# Patient Record
Sex: Male | Born: 1975 | Race: Black or African American | Hispanic: No | Marital: Single | Smoking: Never smoker
Health system: Southern US, Community
[De-identification: ages and names within clinical notes are randomized; demographics above are authoritative.]

## PROBLEM LIST (undated history)

## (undated) DIAGNOSIS — R011 Cardiac murmur, unspecified: Secondary | ICD-10-CM

## (undated) DIAGNOSIS — I10 Essential (primary) hypertension: Secondary | ICD-10-CM

## (undated) DIAGNOSIS — Z8709 Personal history of other diseases of the respiratory system: Secondary | ICD-10-CM

## (undated) HISTORY — PX: HERNIA REPAIR: SHX51

## (undated) HISTORY — DX: Essential (primary) hypertension: I10

## (undated) HISTORY — DX: Personal history of other diseases of the respiratory system: Z87.09

## (undated) HISTORY — PX: EYE SURGERY: SHX253

---

## 2015-03-30 ENCOUNTER — Other Ambulatory Visit (HOSPITAL_COMMUNITY): Payer: Self-pay | Admitting: Internal Medicine

## 2015-03-30 ENCOUNTER — Ambulatory Visit (HOSPITAL_COMMUNITY)
Admission: RE | Admit: 2015-03-30 | Discharge: 2015-03-30 | Disposition: A | Discharge status: Home / Self Care | Payer: PRIVATE HEALTH INSURANCE | Source: Ambulatory Visit | Attending: Internal Medicine | Admitting: Internal Medicine

## 2015-03-30 DIAGNOSIS — M25572 Pain in left ankle and joints of left foot: Secondary | ICD-10-CM

## 2015-06-12 ENCOUNTER — Emergency Department (HOSPITAL_COMMUNITY)
Admission: EM | Admit: 2015-06-12 | Discharge: 2015-06-13 | Disposition: A | Discharge status: Home / Self Care | Payer: No Typology Code available for payment source | Attending: Emergency Medicine | Admitting: Emergency Medicine

## 2015-06-12 ENCOUNTER — Encounter (HOSPITAL_COMMUNITY): Payer: Self-pay | Admitting: Emergency Medicine

## 2015-06-12 DIAGNOSIS — Z79899 Other long term (current) drug therapy: Secondary | ICD-10-CM | POA: Diagnosis not present

## 2015-06-12 DIAGNOSIS — I129 Hypertensive chronic kidney disease with stage 1 through stage 4 chronic kidney disease, or unspecified chronic kidney disease: Secondary | ICD-10-CM | POA: Insufficient documentation

## 2015-06-12 DIAGNOSIS — N19 Unspecified kidney failure: Secondary | ICD-10-CM

## 2015-06-12 DIAGNOSIS — R0602 Shortness of breath: Secondary | ICD-10-CM | POA: Insufficient documentation

## 2015-06-12 DIAGNOSIS — R61 Generalized hyperhidrosis: Secondary | ICD-10-CM | POA: Diagnosis not present

## 2015-06-12 DIAGNOSIS — M549 Dorsalgia, unspecified: Secondary | ICD-10-CM | POA: Diagnosis not present

## 2015-06-12 DIAGNOSIS — R55 Syncope and collapse: Secondary | ICD-10-CM

## 2015-06-12 DIAGNOSIS — N189 Chronic kidney disease, unspecified: Secondary | ICD-10-CM | POA: Insufficient documentation

## 2015-06-12 HISTORY — DX: Cardiac murmur, unspecified: R01.1

## 2015-06-12 HISTORY — DX: Essential (primary) hypertension: I10

## 2015-06-12 LAB — CBC WITH DIFFERENTIAL/PLATELET
BASOS ABS: 0 10*3/uL (ref 0.0–0.1)
Basophils Relative: 1 %
EOS ABS: 0.1 10*3/uL (ref 0.0–0.7)
EOS PCT: 1 %
HCT: 45.7 % (ref 39.0–52.0)
Hemoglobin: 15.2 g/dL (ref 13.0–17.0)
Lymphocytes Relative: 10 %
Lymphs Abs: 1.5 10*3/uL (ref 0.7–4.0)
MCH: 25.9 pg — ABNORMAL LOW (ref 26.0–34.0)
MCHC: 33.3 g/dL (ref 30.0–36.0)
MCV: 77.7 fL — AB (ref 78.0–100.0)
MONO ABS: 0.8 10*3/uL (ref 0.1–1.0)
Monocytes Relative: 6 %
Neutro Abs: 11.6 10*3/uL — ABNORMAL HIGH (ref 1.7–7.7)
Neutrophils Relative %: 83 %
PLATELETS: 256 10*3/uL (ref 150–400)
RBC: 5.88 MIL/uL — AB (ref 4.22–5.81)
RDW: 14.4 % (ref 11.5–15.5)
WBC: 14 10*3/uL — AB (ref 4.0–10.5)

## 2015-06-12 NOTE — ED Notes (Signed)
Pt had syncopal episode at work and when [facility] arrived pt had blood pressure in 80s systolic. Pt denies any pain, but states his pcp changed blood pressure meds 2 months ago. Blood sugar was 150

## 2015-06-12 NOTE — ED Notes (Signed)
Per Lab; CBC Values for 2342 are wrong.

## 2015-06-13 LAB — URINALYSIS, ROUTINE W REFLEX MICROSCOPIC
Bilirubin Urine: NEGATIVE
GLUCOSE, UA: NEGATIVE mg/dL
Hgb urine dipstick: NEGATIVE
KETONES UR: NEGATIVE mg/dL
LEUKOCYTES UA: NEGATIVE
Nitrite: NEGATIVE
PH: 5.5 (ref 5.0–8.0)
Protein, ur: NEGATIVE mg/dL
SPECIFIC GRAVITY, URINE: 1.025 (ref 1.005–1.030)

## 2015-06-13 LAB — COMPREHENSIVE METABOLIC PANEL
ALBUMIN: 4.8 g/dL (ref 3.5–5.0)
ALT: 36 U/L (ref 17–63)
AST: 19 U/L (ref 15–41)
Alkaline Phosphatase: 109 U/L (ref 38–126)
Anion gap: 7 (ref 5–15)
BILIRUBIN TOTAL: 0.6 mg/dL (ref 0.3–1.2)
BUN: 26 mg/dL — AB (ref 6–20)
CHLORIDE: 105 mmol/L (ref 101–111)
CO2: 25 mmol/L (ref 22–32)
Calcium: 9.4 mg/dL (ref 8.9–10.3)
Creatinine, Ser: 1.95 mg/dL — ABNORMAL HIGH (ref 0.61–1.24)
GFR calc Af Amer: 47 mL/min — ABNORMAL LOW (ref >60)
GFR calc non Af Amer: 41 mL/min — ABNORMAL LOW (ref >60)
GLUCOSE: 121 mg/dL — AB (ref 65–99)
POTASSIUM: 3.8 mmol/L (ref 3.5–5.1)
SODIUM: 137 mmol/L (ref 135–145)
Total Protein: 8.5 g/dL — ABNORMAL HIGH (ref 6.5–8.1)

## 2015-06-13 LAB — TROPONIN I: Troponin I: <0.03 ng/mL (ref <0.031)

## 2015-06-13 MED ORDER — SODIUM CHLORIDE 0.9 % IV BOLUS (SEPSIS)
1000.0000 mL | Freq: Once | INTRAVENOUS | Status: AC
  Administered 2015-06-13: 1000 mL via INTRAVENOUS

## 2015-06-13 NOTE — Discharge Instructions (Signed)

## 2015-06-13 NOTE — ED Provider Notes (Signed)
CSN: [id number]     Arrival date & time 06/12/15  2251 History  By signing my name below, I, Marisue HumbleMichelle Chaffee, attest that this documentation has been prepared under the direction and in the presence of Eber HongBrian Chaunte Hornbeck, MD . Electronically Signed: Marisue HumbleMichelle Chaffee, Scribe. 06/13/2015. 1:03 AM.    Chief Complaint  Patient presents with  . Loss of Consciousness   The history is provided by the patient. No language interpreter was used.   HPI Comments:  Jason Carpenter is a 40 y.o. male with PMHx of HTN who presents to the Emergency Department via EMS complaining of near-syncopal episode while at work ~4 hours ago. Pt began feeling diaphoretic then light-headed; he sat down without relief, but denies passing out. Pt reports associated difficulty breathing, back pain and nausea. Pt states he is still feeling mildly lightheaded currently. No alleviating factors noted. He reports good fluid intake and small PO intake today. He notes occasional ETOH use. Denies chest pain, abdominal pain, hematuria, dysuria, diarrhea, leg swelling, h/o CVA, drug use or h/o smoking.  Past Medical History  Diagnosis Date  . Hypertension   . Heart murmur    Past Surgical History  Procedure Laterality Date  . Hernia repair    . Eye surgery     History reviewed. No pertinent family history. Social History  Substance Use Topics  . Smoking status: Never Smoker   . Smokeless tobacco: None  . Alcohol Use: Yes    Review of Systems  Constitutional: Positive for diaphoresis.  Respiratory: Positive for shortness of breath.   Cardiovascular: Negative for chest pain and leg swelling.  Gastrointestinal: Positive for nausea. Negative for abdominal pain and diarrhea.  Genitourinary: Negative for dysuria.  Musculoskeletal: Positive for back pain.  Neurological: Positive for light-headedness. Negative for syncope.  All other systems reviewed and are negative.  Allergies  Penicillins  Home Medications   Prior to  Admission medications   Medication Sig Start Date End Date Taking? Authorizing Provider  UNKNOWN TO PATIENT Take 1 tablet by mouth daily.    Yes Historical Provider, MD   BP 116/73 mmHg  Pulse 70  Temp(Src) 97.9 F (36.6 C) (Oral)  Resp 20  Ht 5\' 3"  (1.6 m)  Wt 230 lb (104.327 kg)  BMI 40.75 kg/m2  SpO2 93% Physical Exam  Constitutional: He appears well-developed and well-nourished. No distress.  HENT:  Head: Normocephalic and atraumatic.  Mouth/Throat: Oropharynx is clear and moist. No oropharyngeal exudate.  Eyes: Conjunctivae and EOM are normal. Pupils are equal, round, and reactive to light. Right eye exhibits no discharge. Left eye exhibits no discharge. No scleral icterus.  Neck: Normal range of motion. Neck supple. No JVD present. No thyromegaly present.  Cardiovascular: Normal rate, regular rhythm, normal heart sounds and intact distal pulses.  Exam reveals no gallop and no friction rub.   No murmur heard. Pulmonary/Chest: Effort normal and breath sounds normal. No respiratory distress. He has no wheezes. He has no rales.  Abdominal: Soft. Bowel sounds are normal. He exhibits no distension and no mass. There is no tenderness.  Musculoskeletal: Normal range of motion. He exhibits no edema or tenderness.  Lymphadenopathy:    He has no cervical adenopathy.  Neurological: He is alert. Coordination normal.  Skin: Skin is warm and dry. No rash noted. No erythema.  Psychiatric: He has a normal mood and affect. His behavior is normal.  Nursing note and vitals reviewed.   ED Course  Procedures  DIAGNOSTIC STUDIES:  Oxygen Saturation is  100% on RA, normal by my interpretation.    COORDINATION OF CARE:  12:37 AM Will order EKG, troponin, CBC, CMP and UA. Discussed treatment plan with pt at bedside and pt agreed to plan.  Labs Review Labs Reviewed  CBC WITH DIFFERENTIAL/PLATELET - Abnormal; Notable for the following:    WBC 14.0 (*)    RBC 5.88 (*)    MCV 77.7 (*)    MCH  25.9 (*)    Neutro Abs 11.6 (*)    All other components within normal limits  COMPREHENSIVE METABOLIC PANEL - Abnormal; Notable for the following:    Glucose, Bld 121 (*)    BUN 26 (*)    Creatinine, Ser 1.95 (*)    Total Protein 8.5 (*)    GFR calc non Af Amer 41 (*)    GFR calc Af Amer 47 (*)    All other components within normal limits  URINALYSIS, ROUTINE W REFLEX MICROSCOPIC (NOT AT [facility])  TROPONIN I    Imaging Review No results found. I have personally reviewed and evaluated these images and lab results as part of my medical decision-making.   EKG Interpretation   Date/Time:  Monday Jun 12 2015 23:00:53 EDT Ventricular Rate:  88 PR Interval:  161 QRS Duration: 109 QT Interval:  372 QTC Calculation: 450 R Axis:   68 Text Interpretation:  Sinus rhythm ST elev, probable normal early repol  pattern Abnormal ekg No old tracing to compare Confirmed by Suann Klier  MD,  Lindsay Straka ([id number]) on 06/13/2015 12:39:41 AM      MDM   Final diagnoses:  Near syncope  Renal failure    The patient has no complaints at this time, he is totally asymptomatic, his EKG is slightly abnormal but appears to be nonischemic, repeat EKG looks improved, troponin is normal. His results show that he has no anemia, he has renal failure with a creatinine of 1.95 but there is no old labs to compare. His urinalysis is clear and clean and his troponin is normal. Cardiac monitoring reveals no arrhythmia, he is not orthostatic when he stands up.  The patient was informed of all of his results including his renal function and given a copy of his results and paper form to take to his doctor's office tomorrow. He is in agreement and will follow up as needed. I do not think he needs to be admitted to the hospital for cardiac monitoring or for further evaluation of his kidney function as he appears very stable and responsible to perform this in the outpatient setting.  Meds given in ED:  Medications  sodium chloride  0.9 % bolus 1,000 mL (1,000 mLs Intravenous New Bag/Given 06/13/15 0118)       Eber Hong, MD 06/13/15 [phone]

## 2018-09-04 ENCOUNTER — Emergency Department (HOSPITAL_COMMUNITY)
Admission: EM | Admit: 2018-09-04 | Discharge: 2018-09-04 | Disposition: A | Discharge status: Home / Self Care | Payer: Managed Care, Other (non HMO) | Attending: Emergency Medicine | Admitting: Emergency Medicine

## 2018-09-04 ENCOUNTER — Other Ambulatory Visit: Payer: Self-pay

## 2018-09-04 ENCOUNTER — Encounter (HOSPITAL_COMMUNITY): Payer: Self-pay

## 2018-09-04 DIAGNOSIS — Z8679 Personal history of other diseases of the circulatory system: Secondary | ICD-10-CM | POA: Diagnosis not present

## 2018-09-04 DIAGNOSIS — I10 Essential (primary) hypertension: Secondary | ICD-10-CM | POA: Insufficient documentation

## 2018-09-04 DIAGNOSIS — Z880 Allergy status to penicillin: Secondary | ICD-10-CM | POA: Insufficient documentation

## 2018-09-04 DIAGNOSIS — R2242 Localized swelling, mass and lump, left lower limb: Secondary | ICD-10-CM | POA: Diagnosis present

## 2018-09-04 DIAGNOSIS — Z9119 Patient's noncompliance with other medical treatment and regimen: Secondary | ICD-10-CM | POA: Diagnosis not present

## 2018-09-04 DIAGNOSIS — R600 Localized edema: Secondary | ICD-10-CM | POA: Insufficient documentation

## 2018-09-04 DIAGNOSIS — M7989 Other specified soft tissue disorders: Secondary | ICD-10-CM

## 2018-09-04 LAB — BASIC METABOLIC PANEL
Anion gap: 6 (ref 5–15)
BUN: 17 mg/dL (ref 6–20)
CO2: 26 mmol/L (ref 22–32)
Calcium: 8.7 mg/dL — ABNORMAL LOW (ref 8.9–10.3)
Chloride: 108 mmol/L (ref 98–111)
Creatinine, Ser: 0.64 mg/dL (ref 0.61–1.24)
GFR calc Af Amer: >60 mL/min (ref >60)
GFR calc non Af Amer: >60 mL/min (ref >60)
Glucose, Bld: 100 mg/dL — ABNORMAL HIGH (ref 70–99)
Potassium: 3.9 mmol/L (ref 3.5–5.1)
Sodium: 140 mmol/L (ref 135–145)

## 2018-09-04 LAB — CBC WITH DIFFERENTIAL/PLATELET
Abs Immature Granulocytes: 0.09 10*3/uL — ABNORMAL HIGH (ref 0.00–0.07)
Basophils Absolute: 0.1 10*3/uL (ref 0.0–0.1)
Basophils Relative: 1 %
Eosinophils Absolute: 0.2 10*3/uL (ref 0.0–0.5)
Eosinophils Relative: 3 %
HCT: 43.3 % (ref 39.0–52.0)
Hemoglobin: 13.8 g/dL (ref 13.0–17.0)
Immature Granulocytes: 1 %
Lymphocytes Relative: 16 %
Lymphs Abs: 1.4 10*3/uL (ref 0.7–4.0)
MCH: 25.1 pg — ABNORMAL LOW (ref 26.0–34.0)
MCHC: 31.9 g/dL (ref 30.0–36.0)
MCV: 78.7 fL — ABNORMAL LOW (ref 80.0–100.0)
Monocytes Absolute: 0.8 10*3/uL (ref 0.1–1.0)
Monocytes Relative: 9 %
Neutro Abs: 6.2 10*3/uL (ref 1.7–7.7)
Neutrophils Relative %: 70 %
Platelets: 273 10*3/uL (ref 150–400)
RBC: 5.5 MIL/uL (ref 4.22–5.81)
RDW: 14.9 % (ref 11.5–15.5)
WBC: 8.7 10*3/uL (ref 4.0–10.5)
nRBC: 0 % (ref 0.0–0.2)

## 2018-09-04 LAB — D-DIMER, QUANTITATIVE: D-Dimer, Quant: <0.27 ug/mL-FEU (ref 0.00–0.50)

## 2018-09-04 MED ORDER — HYDROCHLOROTHIAZIDE 12.5 MG PO CAPS
12.5000 mg | ORAL_CAPSULE | Freq: Once | ORAL | Status: AC
  Administered 2018-09-04: 12.5 mg via ORAL
  Filled 2018-09-04 (×2): qty 1

## 2018-09-04 MED ORDER — LISINOPRIL 10 MG PO TABS
10.0000 mg | ORAL_TABLET | Freq: Once | ORAL | Status: AC
  Administered 2018-09-04: 10 mg via ORAL
  Filled 2018-09-04: qty 1

## 2018-09-04 MED ORDER — LISINOPRIL-HYDROCHLOROTHIAZIDE 10-12.5 MG PO TABS: 1.0000 | ORAL_TABLET | Freq: Every day | ORAL | 0 refills | Status: DC

## 2018-09-04 NOTE — ED Provider Notes (Signed)
[facility] EMERGENCY DEPARTMENT Provider Note   CSN: [id number] Arrival date & time: 09/04/18  1554    History   Chief Complaint Chief Complaint  Patient presents with  . Leg Swelling    HPI Jason Carpenter is a 43 y.o. male with a history of untreated hypertension, stating he last took blood pressure medicine over a year ago presenting with left lower leg swelling which has been waxing and waning for the past 3 weeks.  He works in a job that requires standing for long periods of time, but states he has been doing this for over a year.  The left leg started swelling about 3 weeks ago, it will improve some with elevation but does not completely resolve.  He denies injury to the leg, there is no pain, but he does describe a tight feeling both of his anterior lower leg and in the calf.  His blood pressure was significantly elevated upon first arrival here.  He denies chest pain, shortness of breath, vision changes and current headache, but does endorse getting frequent headaches.  He has had no recent long car rides or plane trips, denies long periods of sedentary behavior.  He has found no alleviators for symptoms.     The history is provided by the patient.    Past Medical History:  Diagnosis Date  . Heart murmur   . Hypertension     There are no active problems to display for this patient.   Past Surgical History:  Procedure Laterality Date  . EYE SURGERY    . HERNIA REPAIR          Home Medications    Prior to Admission medications   Medication Sig Start Date End Date Taking? Authorizing Provider  ibuprofen (ADVIL) 200 MG tablet Take 200 mg by mouth every 6 (six) hours as needed for mild pain or moderate pain.   Yes [provider]  lisinopril-hydrochlorothiazide (ZESTORETIC) 10-12.5 MG tablet Take 1 tablet by mouth daily. 09/04/18   Burgess AmorIdol, Akaash Vandewater, PA-C    Family History History reviewed. No pertinent family history.  Social History Social History   Tobacco  Use  . Smoking status: Never Smoker  . Smokeless tobacco: Never Used  Substance Use Topics  . Alcohol use: Yes  . Drug use: No     Allergies   Penicillins   Review of Systems Review of Systems  Constitutional: Negative for chills and fever.  HENT: Negative for congestion and sore throat.   Eyes: Negative.   Respiratory: Negative for chest tightness and shortness of breath.   Cardiovascular: Positive for leg swelling. Negative for chest pain and palpitations.  Gastrointestinal: Negative for abdominal pain and nausea.  Genitourinary: Negative.   Musculoskeletal: Negative for arthralgias, joint swelling and neck pain.  Skin: Negative.  Negative for color change, rash and wound.  Neurological: Positive for headaches. Negative for dizziness, weakness, light-headedness and numbness.  Psychiatric/Behavioral: Negative.      Physical Exam Updated Vital Signs BP (!) 179/124 (BP Location: Left Arm)   Pulse 82   Temp 98 F (36.7 C) (Oral)   Resp 17   Ht 5\' 3"  (1.6 m)   Wt 108.9 kg   SpO2 96%   BMI 42.51 kg/m   Physical Exam Vitals signs and nursing note reviewed.  Constitutional:      Appearance: He is well-developed.  HENT:     Head: Normocephalic and atraumatic.  Eyes:     Conjunctiva/sclera: Conjunctivae normal.  Neck:  Musculoskeletal: Normal range of motion.  Cardiovascular:     Rate and Rhythm: Normal rate and regular rhythm.     Heart sounds: Normal heart sounds.  Pulmonary:     Effort: Pulmonary effort is normal.     Breath sounds: Normal breath sounds. No wheezing.  Abdominal:     Palpations: Abdomen is soft. There is no mass.     Tenderness: There is no abdominal tenderness.  Musculoskeletal: Normal range of motion.        General: No swelling, tenderness or deformity.     Right lower leg: No edema.     Left lower leg: Edema present.     Comments: Non pitting edema of left lower leg to upper calf.  No cords, no varicose veins appreciated.  Skin  intact, no rash or erythema.  Old fading bruise mid tibia, nontender.   Skin:    General: Skin is warm and dry.  Neurological:     Mental Status: He is alert.      ED Treatments / Results  Labs (all labs ordered are listed, but only abnormal results are displayed) Labs Reviewed  CBC WITH DIFFERENTIAL/PLATELET - Abnormal; Notable for the following components:      Result Value   MCV 78.7 (*)    MCH 25.1 (*)    Abs Immature Granulocytes 0.09 (*)    All other components within normal limits  BASIC METABOLIC PANEL - Abnormal; Notable for the following components:   Glucose, Bld 100 (*)    Calcium 8.7 (*)    All other components within normal limits  D-DIMER, QUANTITATIVE (NOT AT [facility])    EKG None  Radiology No results found.  Procedures Procedures (including critical care time)  Medications Ordered in ED Medications  lisinopril (ZESTRIL) tablet 10 mg (10 mg Oral Given 09/04/18 2159)  hydrochlorothiazide (MICROZIDE) capsule 12.5 mg (12.5 mg Oral Given 09/04/18 2241)     Initial Impression / Assessment and Plan / ED Course  I have reviewed the triage vital signs and the nursing notes.  Pertinent labs & imaging results that were available during my care of the patient were reviewed by me and considered in my medical decision making (see chart for details).        Patient reports that has been several years since he has been on blood pressure medication.  He recalls taking 2 medications one being a diuretic but cannot recall the name of either medicines.  A call to his pharmacy revealed that he was prescribed amlodipine in 2017, then was prescribed losartan in 2019 but never picked up this prescription.  Discussed this with the patient, he states that he had lost his insurance at the time and was unable to get his medications or to follow-up with his PCP.  This is no longer an issue and he is planning to reestablish care.  He was placed on lisinopril/HCTZ combination with his  first dose given here.  There is no evidence for endorgan damage tonight, he has no worrisome symptoms and his kidney function is normal.  His d-dimer is also negative, making DVT as a source of his leg swelling less likely.  However with the swelling being unilateral, ultrasound to rule out this possibility or other vascular issues in the leg was felt reasonable.  This was ordered as an outpatient, patient was given instructions to call for an appointment time tomorrow.  He understands this plan.  Patient's care and plan was discussed with Dr. Lacinda Axon prior to  discharge home.  Final Clinical Impressions(s) / ED Diagnoses   Final diagnoses:  Essential hypertension  Left leg swelling    ED Discharge Orders         Ordered    lisinopril-hydrochlorothiazide (ZESTORETIC) 10-12.5 MG tablet  Daily     09/04/18 2124    US Venous Img Lower Unilateral Left     09/04/18 2125           Burgess Amordol, Artie Takayama, PA-C 09/05/18 0145    Donnetta Hutchingook, Brian, MD 09/05/18 [phone]

## 2018-09-04 NOTE — Discharge Instructions (Addendum)
Call the phone number listed as discussed in the morning for your appointment time here for your ultrasound of your leg tomorrow - this is to ensure you do not have a blood clot in your leg.  Your lab tests are reassuring tonight.  You will be guided further tomorrow if your test is positive.  If it is negative for a clot,  I recommend using elevation as much as you can to help prevent swelling.

## 2018-09-04 NOTE — ED Triage Notes (Signed)
Pt reports leg swellling 3 weeks ago.  Stated that the swelling had went down but now was swollen again

## 2018-09-05 ENCOUNTER — Ambulatory Visit (HOSPITAL_COMMUNITY)
Admission: RE | Admit: 2018-09-05 | Discharge: 2018-09-05 | Disposition: A | Discharge status: Home / Self Care | Payer: Managed Care, Other (non HMO) | Source: Ambulatory Visit | Attending: Emergency Medicine | Admitting: Emergency Medicine

## 2018-09-05 DIAGNOSIS — M7989 Other specified soft tissue disorders: Secondary | ICD-10-CM | POA: Insufficient documentation

## 2018-09-05 NOTE — ED Provider Notes (Signed)
   Jason Carpenter returned today for scheduled out patient venous US of the left LE.  Results are negative for acute DVT and I have discussed this with Jason Carpenter.  He verbalized understanding of his results and agrees to arrange out Jason Carpenter f/u     US Venous Img Lower Unilateral Left  Result Date: 09/05/2018 CLINICAL DATA:  Left lower extremity swelling for 1 month EXAM: Left LOWER EXTREMITY VENOUS DOPPLER ULTRASOUND TECHNIQUE: Gray-scale sonography with graded compression, as well as color Doppler and duplex ultrasound were performed to evaluate the lower extremity deep venous systems from the level of the common femoral vein and including the common femoral, femoral, profunda femoral, popliteal and calf veins including the posterior tibial, peroneal and gastrocnemius veins when visible. The superficial great saphenous vein was also interrogated. Spectral Doppler was utilized to evaluate flow at rest and with distal augmentation maneuvers in the common femoral, femoral and popliteal veins. COMPARISON:  None. FINDINGS: Contralateral Common Femoral Vein: Respiratory phasicity is normal and symmetric with the symptomatic side. No evidence of thrombus. Normal compressibility. Common Femoral Vein: No evidence of thrombus. Normal compressibility, respiratory phasicity and response to augmentation. Saphenofemoral Junction: No evidence of thrombus. Normal compressibility and flow on color Doppler imaging. Profunda Femoral Vein: No evidence of thrombus. Normal compressibility and flow on color Doppler imaging. Femoral Vein: No evidence of thrombus. Normal compressibility, respiratory phasicity and response to augmentation. Popliteal Vein: No evidence of thrombus. Normal compressibility, respiratory phasicity and response to augmentation. Calf Veins: No evidence of thrombus. Normal compressibility and flow on color Doppler imaging. Superficial Great Saphenous Vein: No evidence of thrombus. Normal compressibility. Venous Reflux:  None. Other  Findings: A top-normal size 1.3 cm short axis left inguinal lymph node demonstrates no overtly suspicious sonographic features. IMPRESSION: No evidence of deep venous thrombosis in the left lower extremity. Electronically Signed   By: Ilona Sorrel M.D.   On: 09/05/2018 10:38      Kem Parkinson, PA-C 09/05/18 [place], [place], DO 09/06/18 0730

## 2018-10-23 ENCOUNTER — Emergency Department (HOSPITAL_COMMUNITY)
Admission: EM | Admit: 2018-10-23 | Discharge: 2018-10-23 | Disposition: A | Discharge status: Home / Self Care | Payer: Managed Care, Other (non HMO) | Attending: Emergency Medicine | Admitting: Emergency Medicine

## 2018-10-23 ENCOUNTER — Encounter (HOSPITAL_COMMUNITY): Payer: Self-pay | Admitting: *Deleted

## 2018-10-23 ENCOUNTER — Other Ambulatory Visit: Payer: Self-pay

## 2018-10-23 DIAGNOSIS — A64 Unspecified sexually transmitted disease: Secondary | ICD-10-CM | POA: Diagnosis not present

## 2018-10-23 DIAGNOSIS — R369 Urethral discharge, unspecified: Secondary | ICD-10-CM | POA: Diagnosis present

## 2018-10-23 DIAGNOSIS — I10 Essential (primary) hypertension: Secondary | ICD-10-CM | POA: Insufficient documentation

## 2018-10-23 MED ORDER — AZITHROMYCIN 250 MG PO TABS
1000.0000 mg | ORAL_TABLET | Freq: Once | ORAL | Status: AC
  Administered 2018-10-23: 1000 mg via ORAL
  Filled 2018-10-23: qty 4

## 2018-10-23 MED ORDER — CEFTRIAXONE SODIUM 250 MG IJ SOLR
250.0000 mg | Freq: Once | INTRAMUSCULAR | Status: AC
  Administered 2018-10-23: 250 mg via INTRAMUSCULAR
  Filled 2018-10-23: qty 250

## 2018-10-23 MED ORDER — LIDOCAINE HCL (PF) 1 % IJ SOLN
INTRAMUSCULAR | Status: AC
  Filled 2018-10-23: qty 2

## 2018-10-23 NOTE — ED Provider Notes (Signed)
[facility] EMERGENCY DEPARTMENT Provider Note   CSN: [id number] Arrival date & time: 10/23/18  5053     History   Chief Complaint No chief complaint on file.   HPI Jason Carpenter is a 43 y.o. male.     Patient with history of high blood pressure presents with penile discharge since yesterday.  Patient had sexual intercourse recently and the condom broke.  Patient has had relations this with this woman in the past.  No personal history of STDs.     Past Medical History:  Diagnosis Date   Heart murmur    Hypertension     There are no active problems to display for this patient.   Past Surgical History:  Procedure Laterality Date   EYE SURGERY     HERNIA REPAIR          Home Medications    Prior to Admission medications   Medication Sig Start Date End Date Taking? Authorizing Provider  ibuprofen (ADVIL) 200 MG tablet Take 200 mg by mouth every 6 (six) hours as needed for mild pain or moderate pain.    [provider]  lisinopril-hydrochlorothiazide (ZESTORETIC) 10-12.5 MG tablet Take 1 tablet by mouth daily. 09/04/18   Burgess Amor, PA-C    Family History No family history on file.  Social History Social History   Tobacco Use   Smoking status: Never Smoker   Smokeless tobacco: Never Used  Substance Use Topics   Alcohol use: Yes    Comment: "once in a while" per pt    Drug use: No     Allergies   Penicillins   Review of Systems Review of Systems  Constitutional: Negative for chills and fever.  HENT: Negative for congestion.   Gastrointestinal: Negative for abdominal pain and vomiting.  Genitourinary: Positive for discharge. Negative for dysuria, flank pain and scrotal swelling.  Musculoskeletal: Negative for back pain, neck pain and neck stiffness.  Skin: Negative for rash.  Neurological: Negative for light-headedness and headaches.     Physical Exam Updated Vital Signs BP (!) 164/101    Pulse 89    Temp 98.3 F (36.8 C)  (Oral)    Resp 16    Ht 5\' 3"  (1.6 m)    Wt 111.1 kg    SpO2 97%    BMI 43.40 kg/m   Physical Exam Vitals signs and nursing note reviewed.  Constitutional:      Appearance: He is well-developed.  HENT:     Head: Normocephalic.  Eyes:     General:        Right eye: No discharge.        Left eye: No discharge.  Neck:     Musculoskeletal: Normal range of motion and neck supple.     Trachea: No tracheal deviation.  Cardiovascular:     Rate and Rhythm: Normal rate.  Pulmonary:     Effort: Pulmonary effort is normal.  Abdominal:     General: There is no distension.  Genitourinary:    Comments: Patient has mild opaque discharge, no swelling or tenderness Skin:    General: Skin is warm.     Findings: No rash.  Neurological:     Mental Status: He is alert.      ED Treatments / Results  Labs (all labs ordered are listed, but only abnormal results are displayed) Labs Reviewed - No data to display  EKG None  Radiology No results found.  Procedures Procedures (including critical care time)  Medications Ordered  in ED Medications  cefTRIAXone (ROCEPHIN) injection 250 mg (has no administration in time range)  azithromycin (ZITHROMAX) tablet 1,000 mg (has no administration in time range)     Initial Impression / Assessment and Plan / ED Course  I have reviewed the triage vital signs and the nursing notes.  Pertinent labs & imaging results that were available during my care of the patient were reviewed by me and considered in my medical decision making (see chart for details).       Patient presents with clinical concern for STDs.  Discussed outpatient follow-up.  Intramuscular Rocephin and oral azithromycin given.  Patient has mild penicillin allergy so no contraindication to cephalosporin.  Final Clinical Impressions(s) / ED Diagnoses   Final diagnoses:  STD (male)    ED Discharge Orders    None       Elnora Morrison, MD 10/23/18 1056

## 2018-10-23 NOTE — ED Triage Notes (Addendum)
Pt c/o clear penile discharge and irritation since Sunday. Denies dysuria. Pt reports he takes BP medication but did not take it this morning PTA. Pt's BP 161/101 in triage.

## 2018-10-23 NOTE — Discharge Instructions (Addendum)
Make sure you tell your sexual partner of your symptoms so she can be treated and evaluated. No sexual intercourse for at least 1 week.

## 2018-11-11 ENCOUNTER — Ambulatory Visit: Payer: Managed Care, Other (non HMO) | Admitting: Cardiology

## 2018-11-11 ENCOUNTER — Encounter: Payer: Self-pay | Admitting: Cardiology

## 2018-11-11 ENCOUNTER — Other Ambulatory Visit: Payer: Self-pay

## 2018-11-11 VITALS — BP 156/92 | HR 90 | Ht 63.0 in | Wt 253.0 lb

## 2018-11-11 DIAGNOSIS — I10 Essential (primary) hypertension: Secondary | ICD-10-CM

## 2018-11-11 DIAGNOSIS — R011 Cardiac murmur, unspecified: Secondary | ICD-10-CM | POA: Diagnosis not present

## 2018-11-11 DIAGNOSIS — R0789 Other chest pain: Secondary | ICD-10-CM

## 2018-11-11 NOTE — Progress Notes (Signed)
Cardiology Office Note  Date: 11/11/2018   ID: Jason Carpenter, DOB 1975/06/19, MRN [id number]  PCP:  Celene Squibb, MD  Consulting Cardiologist: Satira Sark, MD Electrophysiologist:  None   Chief Complaint  Patient presents with  . History of chest pain    History of Present Illness: Jason Carpenter is a 43 y.o. male referred for cardiology consultation by Dr. Nevada Crane for the evaluation of chest pain.  I reviewed available records.  Patient presents today, we discussed symptoms and he denied having any problems with chest pain.  He occasionally experiences a brief sense of palpitations, but not on a regular basis, not associated with shortness of breath or syncope.  His main concern was about blood pressure.  He states that he cannot tolerate Zestoretic due to swelling of his eyelids, cramping, and also headache.  Records suggest that he took Norvasc in the past.  He has not followed up with his PCP since August.  He also reports a history of heart murmur, details are not clear.  He recalls being told this in childhood.  He has not had a recent echocardiogram.  Past Medical History:  Diagnosis Date  . Essential hypertension   . Heart murmur   . History of asthma     Past Surgical History:  Procedure Laterality Date  . EYE SURGERY    . HERNIA REPAIR      Current Outpatient Medications  Medication Sig Dispense Refill  . ibuprofen (ADVIL) 200 MG tablet Take 200 mg by mouth every 6 (six) hours as needed for mild pain or moderate pain.    Marland Kitchen lisinopril-hydrochlorothiazide (ZESTORETIC) 10-12.5 MG tablet Take 1 tablet by mouth daily. 30 tablet 0   No current facility-administered medications for this visit.    Allergies:  Penicillins   Social History: The patient  reports that he has never smoked. He has never used smokeless tobacco. He reports current alcohol use. He reports that he does not use drugs.   Family History: The patient's family history includes High blood  pressure in his mother.   ROS:  Please see the history of present illness. Otherwise, complete review of systems is positive for none.  All other systems are reviewed and negative.   Physical Exam: VS:  BP (!) 156/92   Pulse 90   Ht 5\' 3"  (1.6 m)   Wt 253 lb (114.8 kg)   SpO2 96%   BMI 44.82 kg/m , BMI Body mass index is 44.82 kg/m.  Wt Readings from Last 3 Encounters:  11/11/18 253 lb (114.8 kg)  10/23/18 245 lb (111.1 kg)  09/04/18 240 lb (108.9 kg)    General: Obese male, appears comfortable at rest. HEENT: Conjunctiva and redundant lid tissue, wearing a mask. Neck: Supple, no elevated JVP or carotid bruits, no thyromegaly. Lungs: Clear to auscultation, nonlabored breathing at rest. Cardiac: Regular rate and rhythm, no S3, soft systolic murmur, no pericardial rub. Abdomen: Obese, nontender, bowel sounds present, no guarding or rebound. Extremities: No pitting edema, distal pulses 2+. Skin: Warm and dry. Musculoskeletal: No kyphosis. Neuropsychiatric: Alert and oriented x3, affect grossly appropriate.  ECG:  An ECG dated 06/14/2015 was personally reviewed today and demonstrated:  Sinus rhythm with diffuse ST segment abnormalities, possible early repolarization.  Recent Labwork: 09/04/2018: BUN 17; Creatinine, Ser 0.64; Hemoglobin 13.8; Platelets 273; Potassium 3.9; Sodium 140   Other Studies Reviewed Today:  Left lower extremity venous Doppler 09/05/2018: IMPRESSION: No evidence of deep venous thrombosis in the left lower  extremity.  Assessment and Plan:  1.  Essential hypertension by history.  Pressure is not controlled, he is currently not on any antihypertensive medications.  He reports intolerance and possibly allergy to Zestoretic.  I have recommended that he follow-up with his PCP and consider going back on Norvasc which he apparently tolerated in the past.  He might even be able to tolerate Aldactone in addition if needed.  2.  Heart murmur, likely benign.  He does  report history of childhood heart murmur however.  We will obtain an echocardiogram for basic cardiac structural assessment.  3.  Obesity.  Weight loss would be beneficial.  Medication Adjustments/Labs and Tests Ordered: Current medicines are reviewed at length with the patient today.  Concerns regarding medicines are outlined above.   Tests Ordered: Orders Placed This Encounter  Procedures  . ECHOCARDIOGRAM COMPLETE    Medication Changes: No orders of the defined types were placed in this encounter.   Disposition:  Follow up test results.  Signed, Jonelle Sidle, MD, [facility] 11/11/2018 2:58 PM    [facility] [address], [place], [place] [address] Phone: [phone]; Fax: [phone]

## 2018-11-11 NOTE — Patient Instructions (Signed)
Medication Instructions:  Your physician recommends that you continue on your current medications as directed. Please refer to the Current Medication list given to you today.  If you need a refill on your cardiac medications before your next appointment, please call your pharmacy.   Lab work: NONE  If you have labs (blood work) drawn today and your tests are completely normal, you will receive your results only by: Marland Kitchen MyChart Message (if you have MyChart) OR . A paper copy in the mail If you have any lab test that is abnormal or we need to change your treatment, we will call you to review the results.  Testing/Procedures: Your physician has requested that you have an echocardiogram. Echocardiography is a painless test that uses sound waves to create images of your heart. It provides your doctor with information about the size and shape of your heart and how well your heart's chambers and valves are working. This procedure takes approximately one hour. There are no restrictions for this procedure.    Follow-Up: At [facility], you and your health needs are our priority.  As part of our continuing mission to provide you with exceptional heart care, we have created designated Provider Care Teams.  These Care Teams include your primary Cardiologist (physician) and Advanced Practice Providers (APPs -  Physician Assistants and Nurse Practitioners) who all work together to provide you with the care you need, when you need it. You will need a follow up appointment in Pending test results .  Please call our office 2 months in advance to schedule this appointment.  You may see No primary care provider on file. or one of the following Advanced Practice Providers on your designated Care Team:   [place], PA-C [facility]) . Ermalinda Barrios, PA-C ([place])  Any Other Special Instructions Will Be Listed Below (If Applicable). Thank you for choosing [facility]!

## 2018-11-20 ENCOUNTER — Ambulatory Visit (HOSPITAL_COMMUNITY)
Admission: RE | Admit: 2018-11-20 | Discharge: 2018-11-20 | Disposition: A | Discharge status: Home / Self Care | Payer: Managed Care, Other (non HMO) | Source: Ambulatory Visit | Attending: Internal Medicine | Admitting: Internal Medicine

## 2018-11-20 ENCOUNTER — Other Ambulatory Visit: Payer: Self-pay

## 2018-11-20 DIAGNOSIS — R011 Cardiac murmur, unspecified: Secondary | ICD-10-CM | POA: Insufficient documentation

## 2018-11-20 NOTE — Progress Notes (Signed)
*  PRELIMINARY RESULTS* Echocardiogram 2D Echocardiogram has been performed. Patient's blood pressure was 172/102and171/110. Patient told me he had stop taking his blood pressure medication a week ago due to eyelid swelling and cramping. Patient reported no dizziness, headache or blurred vision. I contacted Dr.Hall's office with patient present, they said they would call him and call in a different medication.  Leavy Cella 11/20/2018, 9:16 AM

## 2018-11-23 ENCOUNTER — Telehealth: Payer: Self-pay

## 2018-11-23 NOTE — Telephone Encounter (Signed)
LMTCB-cc 

## 2018-11-23 NOTE — Telephone Encounter (Signed)
-----   Message from Samuel G McDowell, MD sent at 11/22/2018 12:34 PM EDT ----- Results reviewed.  LVEF is normal at 60 to 65%.  He does have diastolic dysfunction which would be consistent with his history of uncontrolled hypertension.  No major valvular abnormalities beyond mild aortic regurgitation.  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  I would recommend that we schedule a chest CTA for evaluation of thoracic aortic anatomy and better determine plan for follow-up.  Please see my recent office consultation, he needs to make sure that he follows up with PCP to get back on regular therapy for treatment of high blood pressure, particularly given recent reported medication intolerance. 

## 2018-11-24 ENCOUNTER — Telehealth: Payer: Self-pay | Admitting: *Deleted

## 2018-11-24 ENCOUNTER — Other Ambulatory Visit: Payer: Self-pay | Admitting: *Deleted

## 2018-11-24 DIAGNOSIS — I10 Essential (primary) hypertension: Secondary | ICD-10-CM

## 2018-11-24 NOTE — Telephone Encounter (Signed)
Called asking better clarification of results.  Patient did not understand.

## 2018-11-24 NOTE — Telephone Encounter (Signed)
-----   Message from Satira Sark, MD sent at 11/22/2018 12:34 PM EDT ----- Results reviewed.  LVEF is normal at 60 to 65%.  He does have diastolic dysfunction which would be consistent with his history of uncontrolled hypertension.  No major valvular abnormalities beyond mild aortic regurgitation.  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  I would recommend that we schedule a chest CTA for evaluation of thoracic aortic anatomy and better determine plan for follow-up.  Please see my recent office consultation, he needs to make sure that he follows up with PCP to get back on regular therapy for treatment of high blood pressure, particularly given recent reported medication intolerance.

## 2018-11-25 NOTE — Telephone Encounter (Signed)
Spoke with pt. Results reviewed again. Pt voiced understanding. Pt voiced that he is unable to contact PCP b/c he gets the VM. Spoke with Ginger at Dr. Juel Burrow office. Informed her of the need to have pt contacted. Ginger confirmed pt phone number and stated she would reach out to pt today.

## 2018-11-25 NOTE — Telephone Encounter (Signed)
Returned pt call. NA, LMTCB  

## 2019-12-20 IMAGING — US VENOUS DOPPLER ULTRASOUND OF LEFT LOWER EXTREMITY
1 series · 13 of 24 positions shown · non-contrast
Comparison: None.

CLINICAL DATA: Left lower extremity swelling for 1 month



[Series 1: venous doppler ultrasound of left lower extremity · 13 of 41 slices shown]
[im 1/41]
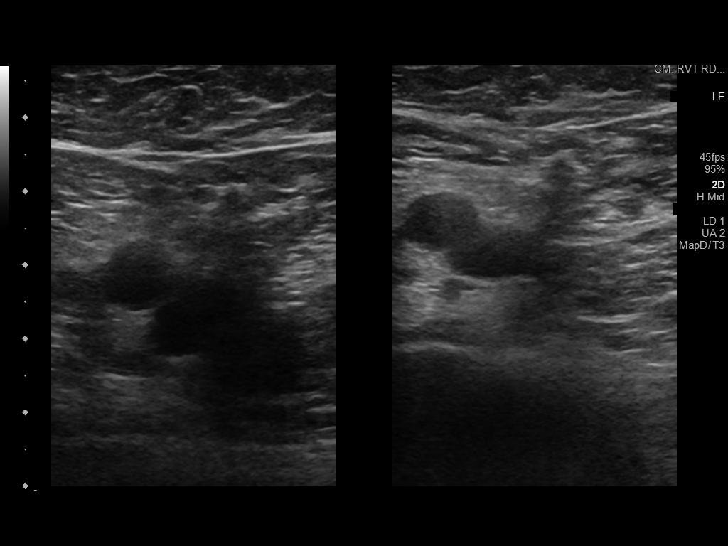
[im 4/41]
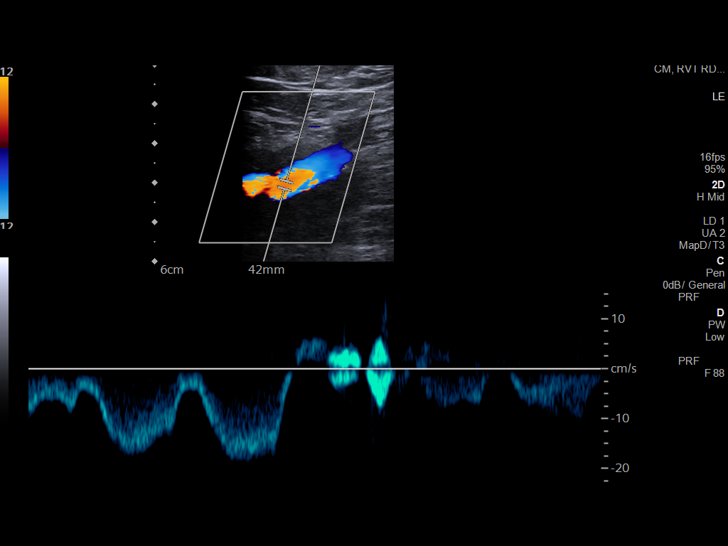
[im 7/41]
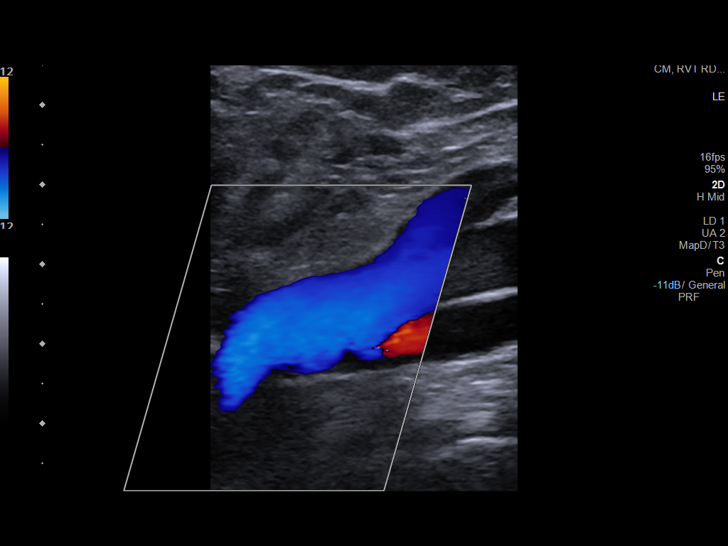
[im 11/41]
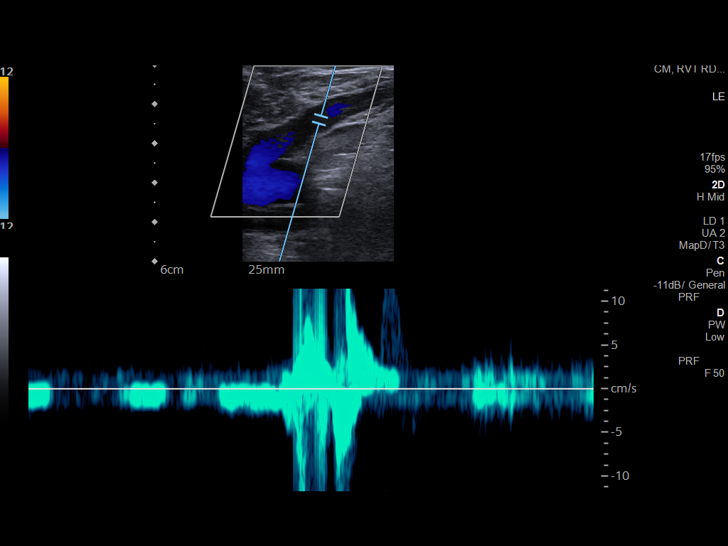
[im 14/41]
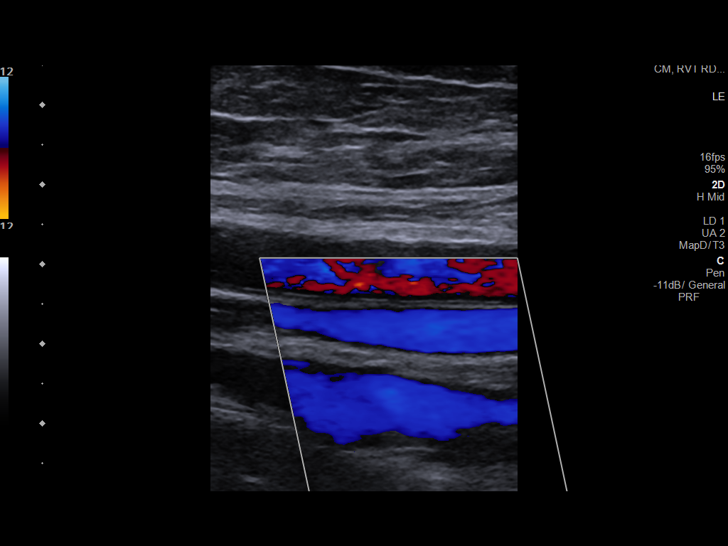
[im 18/41]
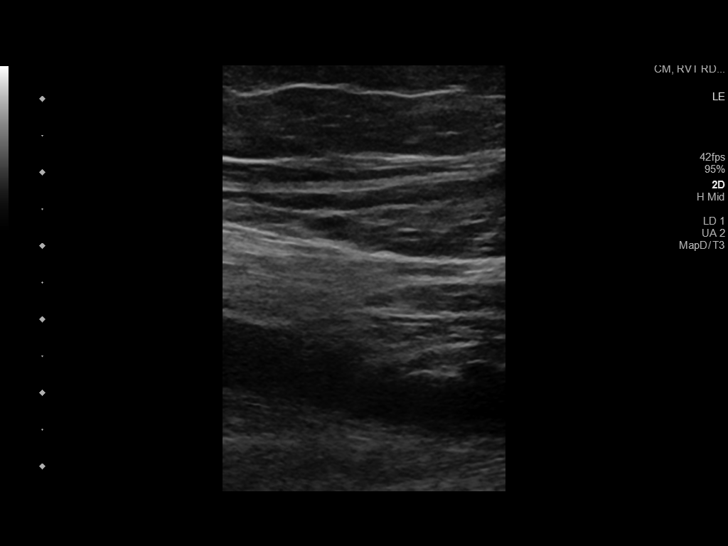
[im 21/41]
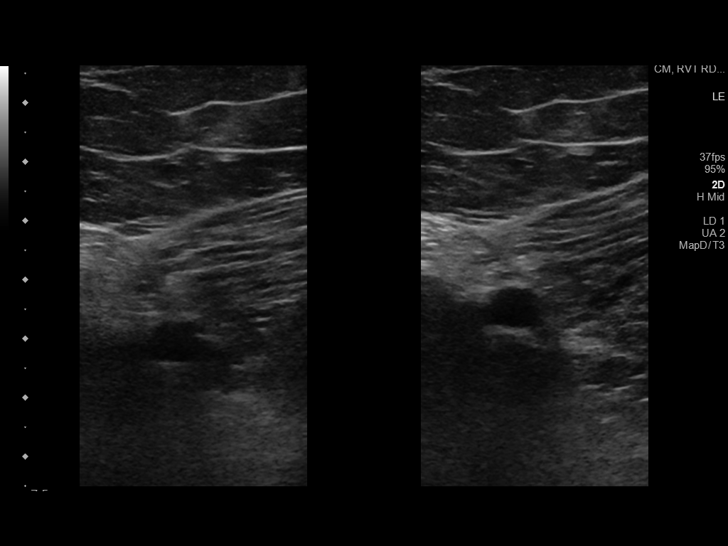
[im 23/41]
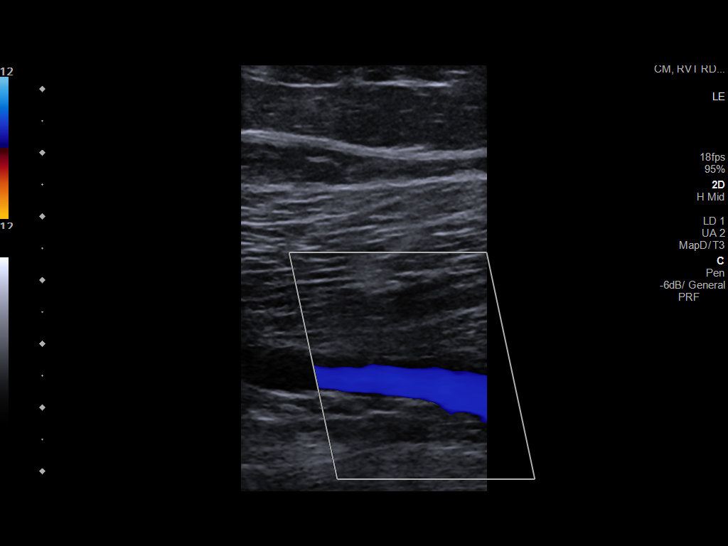
[im 27/41]
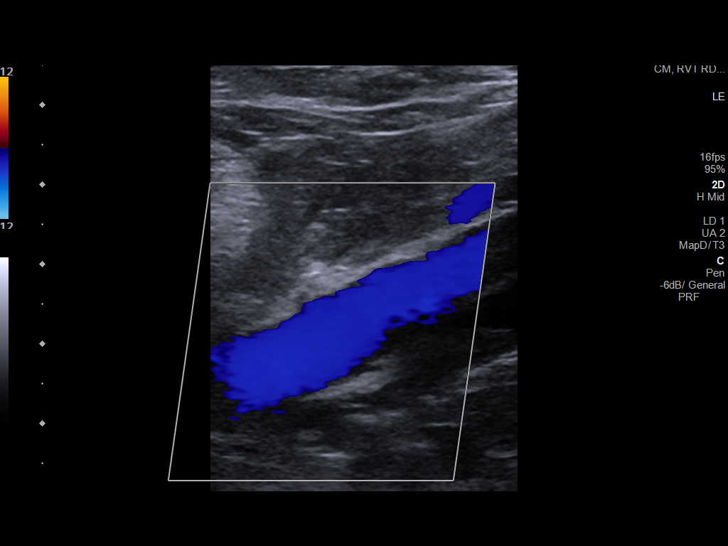
[im 30/41]
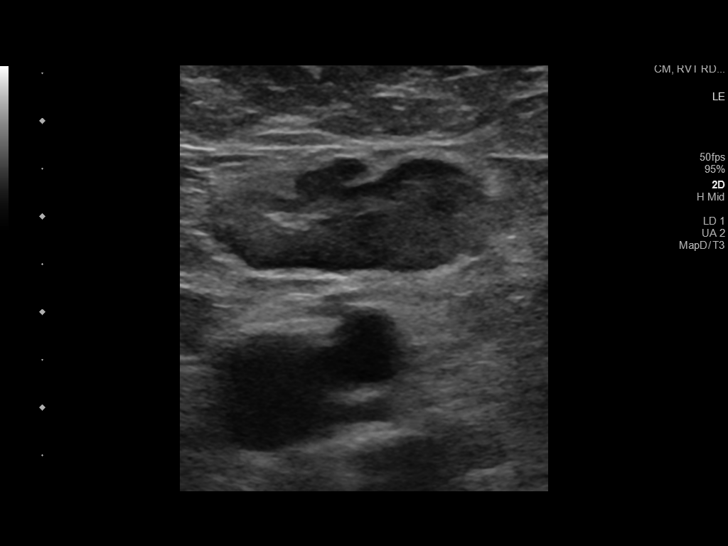
[im 34/41]
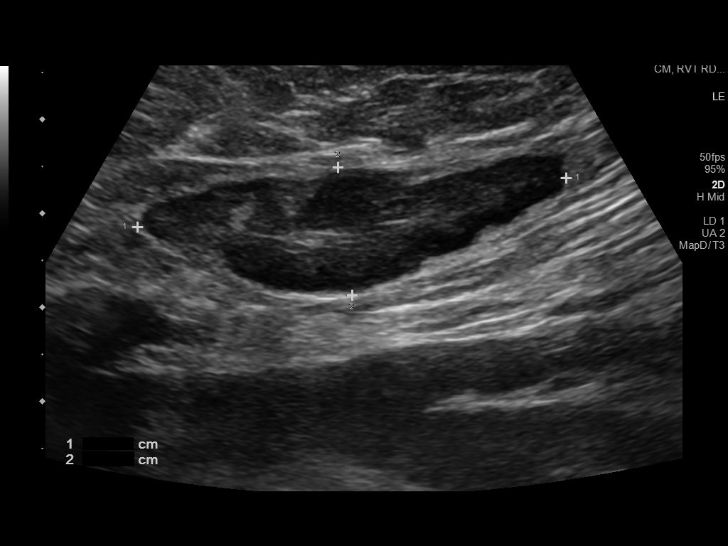
[im 37/41]
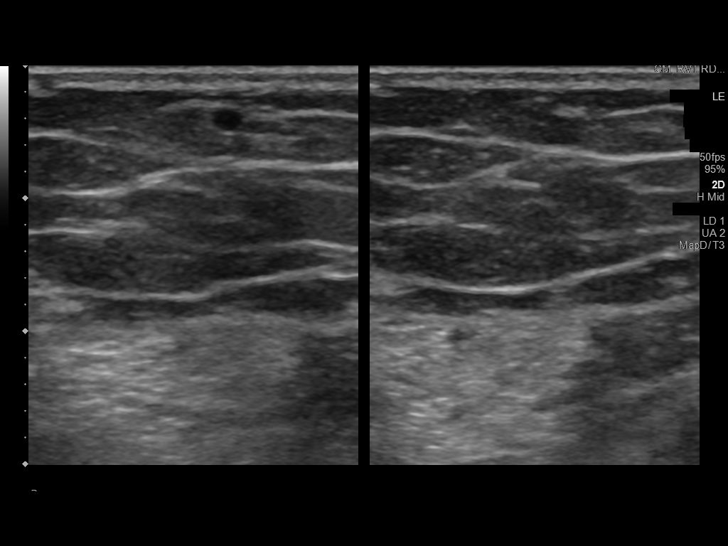
[im 41/41]
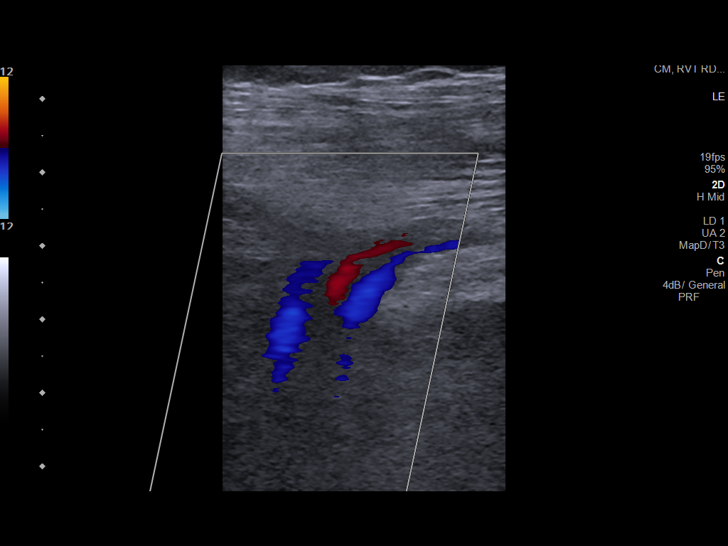

[13 of 24 positions shown; findings below may reference images not displayed]

FINDINGS: Contralateral Common Femoral Vein: Respiratory phasicity is normal
and symmetric with the symptomatic side. No evidence of thrombus.
Normal compressibility.

Common Femoral Vein: No evidence of thrombus. Normal
compressibility, respiratory phasicity and response to augmentation.

Saphenofemoral Junction: No evidence of thrombus. Normal
compressibility and flow on color Doppler imaging.

Profunda Femoral Vein: No evidence of thrombus. Normal
compressibility and flow on color Doppler imaging.

Femoral Vein: No evidence of thrombus. Normal compressibility,
respiratory phasicity and response to augmentation.

Popliteal Vein: No evidence of thrombus. Normal compressibility,
respiratory phasicity and response to augmentation.

Calf Veins: No evidence of thrombus. Normal compressibility and flow
on color Doppler imaging.

Superficial Great Saphenous Vein: No evidence of thrombus. Normal
compressibility.

Venous Reflux:  None.

Other Findings: A top-normal size 1.3 cm short axis left inguinal
lymph node demonstrates no overtly suspicious sonographic features.
IMPRESSION: No evidence of deep venous thrombosis in the left lower extremity.

## 2021-12-28 DIAGNOSIS — Z419 Encounter for procedure for purposes other than remedying health state, unspecified: Secondary | ICD-10-CM | POA: Diagnosis not present

## 2022-01-03 ENCOUNTER — Encounter (HOSPITAL_COMMUNITY): Payer: Self-pay | Admitting: *Deleted

## 2022-01-03 ENCOUNTER — Emergency Department (HOSPITAL_COMMUNITY)
Admission: EM | Admit: 2022-01-03 | Discharge: 2022-01-03 | Disposition: A | Discharge status: Home / Self Care | Payer: Medicaid Other | Attending: Emergency Medicine | Admitting: Emergency Medicine

## 2022-01-03 ENCOUNTER — Other Ambulatory Visit: Payer: Self-pay

## 2022-01-03 ENCOUNTER — Emergency Department (HOSPITAL_COMMUNITY): Payer: Medicaid Other

## 2022-01-03 DIAGNOSIS — X500XXA Overexertion from strenuous movement or load, initial encounter: Secondary | ICD-10-CM | POA: Insufficient documentation

## 2022-01-03 DIAGNOSIS — M25521 Pain in right elbow: Secondary | ICD-10-CM | POA: Diagnosis not present

## 2022-01-03 DIAGNOSIS — Y990 Civilian activity done for income or pay: Secondary | ICD-10-CM | POA: Diagnosis not present

## 2022-01-03 MED ORDER — LIDOCAINE 5 % EX PTCH
1.0000 | MEDICATED_PATCH | CUTANEOUS | Status: DC
  Administered 2022-01-03: 1 via TRANSDERMAL
  Filled 2022-01-03 (×2): qty 1

## 2022-01-03 MED ORDER — LISINOPRIL 10 MG PO TABS
10.0000 mg | ORAL_TABLET | Freq: Once | ORAL | Status: AC
  Administered 2022-01-03: 10 mg via ORAL
  Filled 2022-01-03: qty 1

## 2022-01-03 MED ORDER — HYDROCHLOROTHIAZIDE 12.5 MG PO TABS
12.5000 mg | ORAL_TABLET | Freq: Every day | ORAL | Status: DC
  Administered 2022-01-03: 12.5 mg via ORAL
  Filled 2022-01-03: qty 1

## 2022-01-03 NOTE — Discharge Instructions (Addendum)
You were seen today for evaluation of your right elbow pain. I likely think you may have some muscle injury. This may need additional imaging and will need further evaluation by an orthopedic provider. I have included the information to Dr. Dallas Schimke in in the discharge paperwork. Please make sure you call him to schedule an appointment. I recommended ibuprofen 600mg  and Tylenol 1000mg  every 6 hours as needed for pain. You can keep the ACE bandage on for a few hours or while at work to help with pain. You can also try lidocaine patches as well, you can pick these up OTC. If you have any worsening pain, weakness, fever, redness, numbness, or tingling, please return to the nearest ER for evaluation.    Contact a doctor if: You have pain that gets worse and does not get better with medicine. Your joint pain does not get better in 3 days. You have more bruising or swelling. You have a fever. You lose 10 lb (4.5 kg) or more without trying. Get help right away if: You cannot move the joint. Your fingers or toes tingle, become numb. or get cold and blue. You have a fever along with a joint that is red, warm, and swollen.

## 2022-01-03 NOTE — ED Provider Notes (Signed)
[facility] EMERGENCY DEPARTMENT Provider Note   CSN: [id number] Arrival date & time: 01/03/22  4765     History Chief Complaint  Patient presents with   Arm Pain    Jason Carpenter is a 46 y.o. male with h/o HTN presents to the ER for evaluation of right elbow pain since Monday.  Patient reports that he was lifting a box while at work and he heard a pop and felt pain in his right elbow.  He denies any numbness or tingling.  He reports it radiates some into his upper arm.  He has had pain since with lifting.  He has pain more with flexion than with extension.  He has tried Tylenol for pain without much relief.  He denies any fevers or increased swelling.  He denies any chest pain or shortness of breath.   Arm Pain Pertinent negatives include no chest pain and no shortness of breath.       Home Medications Prior to Admission medications   Medication Sig Start Date End Date Taking? Authorizing Provider  ibuprofen (ADVIL) 200 MG tablet Take 200 mg by mouth every 6 (six) hours as needed for mild pain or moderate pain.    [provider]  lisinopril-hydrochlorothiazide (ZESTORETIC) 10-12.5 MG tablet Take 1 tablet by mouth daily. 09/04/18   Burgess Amor, PA-C      Allergies    Penicillins    Review of Systems   Review of Systems  Constitutional:  Negative for chills and fever.  Respiratory:  Negative for shortness of breath.   Cardiovascular:  Negative for chest pain.  Musculoskeletal:  Positive for arthralgias.    Physical Exam Updated Vital Signs BP (!) 176/102 (BP Location: Left Arm)   Pulse 80   Temp 98 F (36.7 C) (Oral)   Resp 14   Ht 5\' 3"  (1.6 m)   Wt 106.6 kg   SpO2 97%   BMI 41.63 kg/m  Physical Exam Vitals and nursing note reviewed.  Constitutional:      General: He is not in acute distress.    Appearance: Normal appearance. He is not ill-appearing or toxic-appearing.  Eyes:     General: No scleral icterus. Pulmonary:     Effort: Pulmonary effort  is normal. No respiratory distress.  Musculoskeletal:       Arms:     Comments: Small marked area of swelling without overlying erythema or warmth. No bruising noted. The patient does have intact strength, but has more pain with flexion, especially flexion with resistance, of the right arm. Cap refill brisk, palpable pulses, compartments are soft.  No other overlying skin changes noted.  Patient does not have any tenderness into his pec muscles or to his right shoulder.  Other than the small marked area of swelling, arms looks symmetric to each other.  There is no Popeye sign.  Skin:    General: Skin is dry.     Findings: No rash.  Neurological:     General: No focal deficit present.     Mental Status: He is alert. Mental status is at baseline.  Psychiatric:        Mood and Affect: Mood normal.     ED Results / Procedures / Treatments   Labs (all labs ordered are listed, but only abnormal results are displayed) Labs Reviewed - No data to display  EKG None  Radiology DG Elbow Complete Right  Result Date: 01/03/2022 CLINICAL DATA:  Lifting injury 3 days ago with medial swelling. Patient  heard a pop. EXAM: RIGHT ELBOW - COMPLETE 3+ VIEW COMPARISON:  None Available. FINDINGS: The mineralization and alignment are normal. There is no evidence of acute fracture or dislocation. The joint spaces are preserved. No elbow joint effusion, foreign body or focal soft tissue abnormality identified. IMPRESSION: No evidence of acute fracture, dislocation or joint effusion. If concern for biceps tendon injury, consider MRI. Electronically Signed   By: Carey Bullocks M.D.   On: 01/03/2022 10:18    Procedures Procedures   Medications Ordered in ED Medications  lisinopril (ZESTRIL) tablet 10 mg (has no administration in time range)  hydrochlorothiazide (HYDRODIURIL) tablet 12.5 mg (has no administration in time range)  lidocaine (LIDODERM) 5 % 1 patch (has no administration in time range)    ED  Course/ Medical Decision Making/ A&P                           Medical Decision Making Amount and/or Complexity of Data Reviewed Radiology: ordered.  Risk Prescription drug management.   46 year old male presents the emergency room today for evaluation of his right elbow pain for the past 4 days.  Differential diagnosis includes was limited to sprain, strain, tendon or ligament injury, muscular injury, tear.  Vital signs show elevated blood pressure although patient did not take his blood pressure medication.  Afebrile, normal pulse rate, satting well on room air without increased work of breathing.  Physical exam as noted above.  Although I doubt a fracture as the patient was just lifting heavy box, will order an x-ray to rule out any avulsion fracture from possible tendon or ligament damage.  X-ray imaging shows no evidence of any acute fracture, dislocation, or joint effusion.  Recommended nonemergent MRI to rule out any biceps tendon injury.  Will include information for the orthopedic provider as the patient will likely need to follow-up with [place] for additional imaging and for reevaluation and further care.  I doubt any septic arthritis at this time given the patient does not have any overlying warmth or erythema to the joint.  He has mild swelling is noted just proximal to the joint.  He is neurovascularly intact.  Strength is intact just with some pain with flexion and flexion with against resistance.  His blood pressure at home medications were ordered here today.  We applied a lidocaine patch and some Ace bandage for support.  Will give him a work note so that he can spend some time off with that arm.  Encouraged him to not lift heavy things with that arm as well.  Recommended Tylenol and ibuprofen as needed for pain.  We discussed return precautions and red flag symptoms.  Discussed his need to follow-up with the orthopedic provider for further evaluation and possible further imaging.   Patient verbalizes understanding and agrees to the plan.  Patient is stable being discharged home in good condition.  Final Clinical Impression(s) / ED Diagnoses Final diagnoses:  Right elbow pain    Rx / DC Orders ED Discharge Orders     None         Achille Rich, PA-C 01/03/22 1037    Terald Sleeper, MD 01/03/22 1429

## 2022-01-03 NOTE — ED Triage Notes (Addendum)
Pt c/o right arm pain from shoulder down to mid arm x 1 week after he picked up a box. Pt c/o pain with flexing and lifting arm.   Pt's BP 176/102 in triage. Pt reports he hasn't taken his BP medication today.

## 2022-01-28 DIAGNOSIS — Z419 Encounter for procedure for purposes other than remedying health state, unspecified: Secondary | ICD-10-CM | POA: Diagnosis not present

## 2022-02-14 ENCOUNTER — Ambulatory Visit (INDEPENDENT_AMBULATORY_CARE_PROVIDER_SITE_OTHER): Payer: Medicaid Other | Admitting: Family Medicine

## 2022-02-14 ENCOUNTER — Encounter: Payer: Self-pay | Admitting: Family Medicine

## 2022-02-14 VITALS — BP 182/101 | HR 90 | Ht 63.0 in | Wt 249.0 lb

## 2022-02-14 DIAGNOSIS — R0602 Shortness of breath: Secondary | ICD-10-CM | POA: Diagnosis not present

## 2022-02-14 DIAGNOSIS — E039 Hypothyroidism, unspecified: Secondary | ICD-10-CM | POA: Diagnosis not present

## 2022-02-14 DIAGNOSIS — E785 Hyperlipidemia, unspecified: Secondary | ICD-10-CM

## 2022-02-14 DIAGNOSIS — Z1159 Encounter for screening for other viral diseases: Secondary | ICD-10-CM

## 2022-02-14 DIAGNOSIS — R7301 Impaired fasting glucose: Secondary | ICD-10-CM | POA: Diagnosis not present

## 2022-02-14 DIAGNOSIS — I10 Essential (primary) hypertension: Secondary | ICD-10-CM

## 2022-02-14 DIAGNOSIS — Z114 Encounter for screening for human immunodeficiency virus [HIV]: Secondary | ICD-10-CM

## 2022-02-14 MED ORDER — OLMESARTAN MEDOXOMIL-HCTZ 40-12.5 MG PO TABS: 1.0000 | ORAL_TABLET | Freq: Every day | ORAL | 1 refills | Status: DC

## 2022-02-14 MED ORDER — ALBUTEROL SULFATE HFA 108 (90 BASE) MCG/ACT IN AERS: 2.0000 | INHALATION_SPRAY | Freq: Four times a day (QID) | RESPIRATORY_TRACT | 2 refills | Status: DC | PRN

## 2022-02-14 MED ORDER — TELMISARTAN-HCTZ 40-12.5 MG PO TABS: 1.0000 | ORAL_TABLET | Freq: Every day | ORAL | 1 refills | Status: DC

## 2022-02-14 NOTE — Assessment & Plan Note (Addendum)
Blood pressure not controlled 181/101, 186/101  Discontinued lisinopril-hydrochlorothiazide 10-12.5 Started patient on Olmesartan-HCTZ 40-12.5 MG  Advise patient on taking blood pressure medication daily and importance on following a DASH diet and exercise maintenance Avdise pt to visit the ER for HTN crisis EKG and Labs ordered Follow-up in 2 weeks for hypertension management- with blood pressure reading log

## 2022-02-14 NOTE — Patient Instructions (Signed)
Please take blood pressure medication once daily Follow up in two weeks with blood pressure log Visit the Emergency room for Hypertension Crisis

## 2022-02-14 NOTE — Progress Notes (Addendum)
New Patient Office Visit   Subjective   Patient ID: Jason Carpenter, male    DOB: December 11, 1975  Age: 47 y.o. MRN: [id number]  CC:  Chief Complaint  Patient presents with   Establish Care   Hypertension    HPI Jason Carpenter presents to establish care. He  has a past medical history of Essential hypertension, Heart murmur, and History of asthma.  Patient here for follow-up of elevated blood pressure. He is not exercising and is not adherent to low salt diet. Blood pressure is not well controlled at home. Cardiac symptoms chest pain. Patient denies dyspnea, fatigue, palpitations, and syncope.Cardiovascular risk factors: hypertension, male gender, and sedentary lifestyle. Patient states noncompliant with blood pressure medication.  Hypertension This is a chronic problem. The problem has been gradually worsening since onset. The problem is uncontrolled. Associated symptoms include blurred vision, chest pain, headaches and shortness of breath. Risk factors for coronary artery disease include male gender, obesity and sedentary lifestyle. Past treatments include ACE inhibitors. The current treatment provides no improvement. Compliance problems include diet.       Outpatient Encounter Medications as of 02/14/2022  Medication Sig   albuterol (VENTOLIN HFA) 108 (90 Base) MCG/ACT inhaler Inhale 2 puffs into the lungs every 6 (six) hours as needed for wheezing or shortness of breath.   ibuprofen (ADVIL) 200 MG tablet Take 200 mg by mouth every 6 (six) hours as needed for mild pain or moderate pain.   olmesartan-hydrochlorothiazide (BENICAR HCT) 40-12.5 MG tablet Take 1 tablet by mouth daily.   [DISCONTINUED] lisinopril-hydrochlorothiazide (ZESTORETIC) 10-12.5 MG tablet Take 1 tablet by mouth daily.   [DISCONTINUED] telmisartan-hydrochlorothiazide (MICARDIS HCT) 40-12.5 MG tablet Take 1 tablet by mouth daily.   No facility-administered encounter medications on file as of 02/14/2022.    Past  Surgical History:  Procedure Laterality Date   EYE SURGERY     HERNIA REPAIR      Review of Systems  Constitutional:  Negative for chills and fever.  HENT:  Negative for hearing loss and tinnitus.   Eyes:  Positive for blurred vision and double vision.  Respiratory:  Positive for shortness of breath.   Cardiovascular:  Positive for chest pain.  Gastrointestinal:  Negative for abdominal pain, heartburn, nausea and vomiting.  Genitourinary:  Negative for dysuria.  Musculoskeletal:  Negative for myalgias.  Skin:  Negative for rash.  Neurological:  Positive for headaches. Negative for dizziness.      Objective    BP (!) 182/101   Pulse 90   Ht 5\' 3"  (1.6 m)   Wt 249 lb (112.9 kg)   SpO2 95%   BMI 44.11 kg/m   Physical Exam Constitutional:      General: He is not in acute distress. HENT:     Head: Normocephalic.  Cardiovascular:     Rate and Rhythm: Normal rate and regular rhythm.     Pulses: Normal pulses.     Heart sounds: No murmur heard. Pulmonary:     Effort: Pulmonary effort is normal.     Breath sounds: Normal breath sounds.  Skin:    General: Skin is warm and dry.     Capillary Refill: Capillary refill takes less than 2 seconds.  Neurological:     Mental Status: He is alert.  Psychiatric:        Mood and Affect: Mood normal.       Assessment & Plan:  Primary hypertension Assessment & Plan: Blood pressure not controlled 181/101, 186/101  Discontinued lisinopril-hydrochlorothiazide 10-12.5  Started patient on Olmesartan-HCTZ 40-12.5 MG  Advise patient on taking blood pressure medication daily and importance on following a DASH diet and exercise maintenance Avdise pt to visit the ER for HTN crisis EKG and Labs ordered Follow-up in 2 weeks for hypertension management- with blood pressure reading log  Orders: -     CBC with Differential/Platelet -     CMP14+EGFR -     Microalbumin / creatinine urine ratio -     EKG 12-Lead -     Olmesartan  Medoxomil-HCTZ; Take 1 tablet by mouth daily.  Dispense: 30 tablet; Refill: 1  Hyperlipidemia, unspecified hyperlipidemia type -     Lipid panel  IFG (impaired fasting glucose) -     Hemoglobin A1c  Need for hepatitis C screening test -     Hepatitis C antibody  Screening for HIV (human immunodeficiency virus) -     HIV Antibody (routine testing w rflx)  Hypothyroidism, unspecified type -     TSH + free T4  SOB (shortness of breath) -     Albuterol Sulfate HFA; Inhale 2 puffs into the lungs every 6 (six) hours as needed for wheezing or shortness of breath.  Dispense: 8 g; Refill: 2    Return in about 2 weeks (around 02/28/2022) for hypertension.   Renard Hamper Ria Comment, FNP

## 2022-02-14 NOTE — Addendum Note (Signed)
Addended by: Juanda Chance on: 02/14/2022 10:41 AM   Modules accepted: Orders

## 2022-02-16 LAB — CMP14+EGFR
ALT: 14 IU/L (ref 0–44)
AST: 14 IU/L (ref 0–40)
Albumin/Globulin Ratio: 1.6 (ref 1.2–2.2)
Albumin: 4.4 g/dL (ref 4.1–5.1)
Alkaline Phosphatase: 148 IU/L — ABNORMAL HIGH (ref 44–121)
BUN/Creatinine Ratio: 17 (ref 9–20)
BUN: 12 mg/dL (ref 6–24)
Bilirubin Total: 0.3 mg/dL (ref 0.0–1.2)
CO2: 22 mmol/L (ref 20–29)
Calcium: 8.8 mg/dL (ref 8.7–10.2)
Chloride: 105 mmol/L (ref 96–106)
Creatinine, Ser: 0.7 mg/dL — ABNORMAL LOW (ref 0.76–1.27)
Globulin, Total: 2.8 g/dL (ref 1.5–4.5)
Glucose: 96 mg/dL (ref 70–99)
Potassium: 4.1 mmol/L (ref 3.5–5.2)
Sodium: 139 mmol/L (ref 134–144)
Total Protein: 7.2 g/dL (ref 6.0–8.5)
eGFR: 114 mL/min/{1.73_m2} (ref >59)

## 2022-02-16 LAB — TSH+FREE T4
Free T4: 1.18 ng/dL (ref 0.82–1.77)
TSH: 2.59 u[IU]/mL (ref 0.450–4.500)

## 2022-02-16 LAB — CBC WITH DIFFERENTIAL/PLATELET
Basophils Absolute: 0.1 10*3/uL (ref 0.0–0.2)
Basos: 1 %
EOS (ABSOLUTE): 0.4 10*3/uL (ref 0.0–0.4)
Eos: 5 %
Hematocrit: 39.2 % (ref 37.5–51.0)
Hemoglobin: 12.9 g/dL — ABNORMAL LOW (ref 13.0–17.7)
Immature Grans (Abs): 0 10*3/uL (ref 0.0–0.1)
Immature Granulocytes: 1 %
Lymphocytes Absolute: 1.2 10*3/uL (ref 0.7–3.1)
Lymphs: 17 %
MCH: 25.2 pg — ABNORMAL LOW (ref 26.6–33.0)
MCHC: 32.9 g/dL (ref 31.5–35.7)
MCV: 77 fL — ABNORMAL LOW (ref 79–97)
Monocytes Absolute: 0.5 10*3/uL (ref 0.1–0.9)
Monocytes: 6 %
Neutrophils Absolute: 5.3 10*3/uL (ref 1.4–7.0)
Neutrophils: 70 %
Platelets: 285 10*3/uL (ref 150–450)
RBC: 5.12 x10E6/uL (ref 4.14–5.80)
RDW: 13.9 % (ref 11.6–15.4)
WBC: 7.5 10*3/uL (ref 3.4–10.8)

## 2022-02-16 LAB — LIPID PANEL
Chol/HDL Ratio: 3.7 ratio (ref 0.0–5.0)
Cholesterol, Total: 161 mg/dL (ref 100–199)
HDL: 43 mg/dL (ref >39)
LDL Chol Calc (NIH): 109 mg/dL — ABNORMAL HIGH (ref 0–99)
Triglycerides: 39 mg/dL (ref 0–149)
VLDL Cholesterol Cal: 9 mg/dL (ref 5–40)

## 2022-02-16 LAB — HEMOGLOBIN A1C
Est. average glucose Bld gHb Est-mCnc: 108 mg/dL
Hgb A1c MFr Bld: 5.4 % (ref 4.8–5.6)

## 2022-02-16 LAB — HIV ANTIBODY (ROUTINE TESTING W REFLEX): HIV Screen 4th Generation wRfx: NONREACTIVE

## 2022-02-16 LAB — MICROALBUMIN / CREATININE URINE RATIO
Creatinine, Urine: 82.3 mg/dL
Microalb/Creat Ratio: 10 mg/g creat (ref 0–29)
Microalbumin, Urine: 8.5 ug/mL

## 2022-02-16 LAB — HEPATITIS C ANTIBODY: Hep C Virus Ab: NONREACTIVE

## 2022-02-16 NOTE — Progress Notes (Signed)
Please inform patient,  Cholesterol levels slightly elevated, start lifestyle modifications follow diet low in saturated fat, reduce dietary salt intake maintain an exercise routine 3 to 5 days a week for a minimum 30 minutes a day.

## 2022-02-28 DIAGNOSIS — Z419 Encounter for procedure for purposes other than remedying health state, unspecified: Secondary | ICD-10-CM | POA: Diagnosis not present

## 2022-03-05 ENCOUNTER — Encounter: Payer: Self-pay | Admitting: Family Medicine

## 2022-03-05 ENCOUNTER — Ambulatory Visit (INDEPENDENT_AMBULATORY_CARE_PROVIDER_SITE_OTHER): Payer: Medicaid Other | Admitting: Family Medicine

## 2022-03-05 VITALS — BP 162/102 | HR 86 | Ht 63.0 in | Wt 246.0 lb

## 2022-03-05 DIAGNOSIS — I10 Essential (primary) hypertension: Secondary | ICD-10-CM | POA: Diagnosis not present

## 2022-03-05 MED ORDER — AMLODIPINE BESYLATE 10 MG PO TABS: 10.0000 mg | ORAL_TABLET | Freq: Every day | ORAL | 1 refills | Status: DC

## 2022-03-05 NOTE — Assessment & Plan Note (Signed)
Patient at home blood pressure log reading:  168/98, 153/87, 162/96, 158/90, 150/86, 158/98 Patient is complaint with taking olmesartan-hydrochlorothiazide 40-12.5 mg Prescribed Norvasc 10 mg  Follow up in 2 weeks with blood pressure logs to evaluate trends

## 2022-03-05 NOTE — Progress Notes (Signed)
Patient Office Visit   Subjective   Patient ID: Jason Carpenter, male    DOB: [birth year]  Age: 47 y.o. MRN: [id number]  CC:  Chief Complaint  Patient presents with   Hypertension    Patient states he has been taking his medication daily, and BP at home is around 150/86.     HPI Jason Carpenter presents to clinic for follow up for elevated blood pressure. He  has a past medical history of Essential hypertension, Heart murmur, and History of asthma.  Patient here for follow-up of elevated blood pressure.He is exercising, is walking twice a times a day and is adherent to low salt diet. Blood pressure is not well controlled at home. Patient denies cardiac symptoms chest pain, dyspnea, lower extremity edema, palpitations, and tachypnea.  Cardiovascular risk factors: dyslipidemia, hypertension, male gender, obesity (BMI >= 30 kg/m2), and sedentary lifestyle.     Outpatient Encounter Medications as of 03/05/2022  Medication Sig   albuterol (VENTOLIN HFA) 108 (90 Base) MCG/ACT inhaler Inhale 2 puffs into the lungs every 6 (six) hours as needed for wheezing or shortness of breath.   amLODipine (NORVASC) 10 MG tablet Take 1 tablet (10 mg total) by mouth daily.   ibuprofen (ADVIL) 200 MG tablet Take 200 mg by mouth every 6 (six) hours as needed for mild pain or moderate pain.   olmesartan-hydrochlorothiazide (BENICAR HCT) 40-12.5 MG tablet Take 1 tablet by mouth daily.   No facility-administered encounter medications on file as of 03/05/2022.    Past Surgical History:  Procedure Laterality Date   EYE SURGERY     HERNIA REPAIR      Review of Systems  Constitutional:  Negative for chills and fever.  Respiratory:  Negative for shortness of breath.   Cardiovascular:  Negative for chest pain, palpitations, orthopnea, claudication, leg swelling and PND.  Neurological:  Negative for dizziness and headaches.  Psychiatric/Behavioral:  Negative for depression. The patient is not nervous/anxious.        Objective    BP (!) 162/102   Pulse 86   Ht 5\' 3"  (1.6 m)   Wt 246 lb (111.6 kg)   SpO2 95%   BMI 43.58 kg/m   Physical Exam Vitals reviewed.  Constitutional:      Appearance: Normal appearance.  Cardiovascular:     Rate and Rhythm: Normal rate and regular rhythm.     Pulses: Normal pulses.     Heart sounds: Normal heart sounds.  Pulmonary:     Effort: Pulmonary effort is normal. No respiratory distress.     Breath sounds: Normal breath sounds.  Musculoskeletal:        General: No swelling. Normal range of motion.     Right lower leg: No edema.     Left lower leg: No edema.  Skin:    General: Skin is warm and dry.     Capillary Refill: Capillary refill takes less than 2 seconds.  Neurological:     General: No focal deficit present.     Mental Status: He is alert.     Motor: No weakness.     Coordination: Coordination normal.  Psychiatric:        Mood and Affect: Mood normal.       Assessment & Plan:  Primary hypertension Assessment & Plan: Patient at home blood pressure log reading:  168/98, 153/87, 162/96, 158/90, 150/86, 158/98 Patient is complaint with taking olmesartan-hydrochlorothiazide 40-12.5 mg Prescribed Norvasc 10 mg  Follow up in 2 weeks with blood  pressure logs to evaluate trends  Orders: -     amLODIPine Besylate; Take 1 tablet (10 mg total) by mouth daily.  Dispense: 30 tablet; Refill: 1    Return in about 2 weeks (around 03/19/2022) for hypertension, re-check blood pressure.   Renard Hamper Ria Comment, FNP

## 2022-03-05 NOTE — Patient Instructions (Signed)
Please take medications as prescribed. Follow up in 2 weeks with blood pressure logs

## 2022-03-17 ENCOUNTER — Encounter (HOSPITAL_COMMUNITY): Payer: Self-pay

## 2022-03-17 ENCOUNTER — Emergency Department (HOSPITAL_COMMUNITY)
Admission: EM | Admit: 2022-03-17 | Discharge: 2022-03-17 | Disposition: A | Discharge status: Home / Self Care | Payer: Medicaid Other | Attending: Emergency Medicine | Admitting: Emergency Medicine

## 2022-03-17 ENCOUNTER — Other Ambulatory Visit: Payer: Self-pay

## 2022-03-17 DIAGNOSIS — H5789 Other specified disorders of eye and adnexa: Secondary | ICD-10-CM | POA: Diagnosis present

## 2022-03-17 DIAGNOSIS — T783XXA Angioneurotic edema, initial encounter: Secondary | ICD-10-CM | POA: Insufficient documentation

## 2022-03-17 DIAGNOSIS — I10 Essential (primary) hypertension: Secondary | ICD-10-CM | POA: Diagnosis not present

## 2022-03-17 DIAGNOSIS — Z79899 Other long term (current) drug therapy: Secondary | ICD-10-CM | POA: Insufficient documentation

## 2022-03-17 MED ORDER — METHYLPREDNISOLONE SODIUM SUCC 125 MG IJ SOLR
125.0000 mg | Freq: Once | INTRAMUSCULAR | Status: AC
  Administered 2022-03-17: 125 mg via INTRAVENOUS
  Filled 2022-03-17: qty 2

## 2022-03-17 MED ORDER — CETIRIZINE HCL 10 MG PO TABS: 10.0000 mg | ORAL_TABLET | Freq: Two times a day (BID) | ORAL | 0 refills | Status: DC

## 2022-03-17 MED ORDER — EPINEPHRINE 0.3 MG/0.3ML IJ SOAJ: 0.3000 mg | INTRAMUSCULAR | 0 refills | Status: DC | PRN

## 2022-03-17 MED ORDER — FAMOTIDINE IN NACL 20-0.9 MG/50ML-% IV SOLN
20.0000 mg | Freq: Once | INTRAVENOUS | Status: AC
  Administered 2022-03-17: 20 mg via INTRAVENOUS
  Filled 2022-03-17: qty 50

## 2022-03-17 MED ORDER — DIPHENHYDRAMINE HCL 50 MG/ML IJ SOLN
50.0000 mg | Freq: Once | INTRAMUSCULAR | Status: AC
  Administered 2022-03-17: 50 mg via INTRAVENOUS
  Filled 2022-03-17: qty 1

## 2022-03-17 MED ORDER — PREDNISONE 50 MG PO TABS: 50.0000 mg | ORAL_TABLET | Freq: Every day | ORAL | 0 refills | Status: AC

## 2022-03-17 NOTE — Discharge Instructions (Signed)
Today for swelling in your eyelids and lower lip.  You are being treated with antihistamines and steroids to help with this.  Since your swelling has gotten slightly better and does not involve any trouble swallowing or breathing we will send you home with medications.  Follow-up closely with your primary care doctor, come back to the ER if you have any new or worsening symptoms.  I have written a prescription for an EpiPen.  Use this as directed if you develop symptoms of life-threatening allergic reaction such as swelling of your airway or trouble breathing.  If you use this you need to come directly to the ED.

## 2022-03-17 NOTE — ED Provider Notes (Signed)
[facility] Provider Note   CSN: [id number] Arrival date & time: 03/17/22  1359     History  No chief complaint on file.   Jason Carpenter is a 47 y.o. male.  He reports history of hypertension.  He presents today for possible allergic reaction with swelling to his eyes lids bilaterally and lips.  He states this started yesterday after eating a steak sandwich and thinks maybe there was something in it he was allergic to.  He states initially was mild and just his eyelids that he came home and ate the other half of a sandwich and had more diffuse swelling.  He tried Benadryl around 530 this morning without any significant relief.  He states he has something similar a few weeks ago when he came to contact with some shrimp, but it went away after a couple of days.  Swelling is bilateral and symmetric, there is no swelling of his tongue, voice changes, trouble swallowing or breathing, no rash or itching.  HPI     Home Medications Prior to Admission medications   Medication Sig Start Date End Date Taking? Authorizing Provider  cetirizine (ZYRTEC ALLERGY) 10 MG tablet Take 1 tablet (10 mg total) by mouth 2 (two) times daily. 03/17/22  Yes Jody Silas A, PA-C  EPINEPHrine 0.3 mg/0.3 mL IJ SOAJ injection Inject 0.3 mg into the muscle as needed for anaphylaxis. 03/17/22  Yes Janiyah Beery A, PA-C  predniSONE (DELTASONE) 50 MG tablet Take 1 tablet (50 mg total) by mouth daily with breakfast for 5 days. 03/17/22 03/22/22 Yes Ailsa Mireles A, PA-C  albuterol (VENTOLIN HFA) 108 (90 Base) MCG/ACT inhaler Inhale 2 puffs into the lungs every 6 (six) hours as needed for wheezing or shortness of breath. 02/14/22   Del Eli Hose, FNP  amLODipine (NORVASC) 10 MG tablet Take 1 tablet (10 mg total) by mouth daily. 03/05/22   Del Eli Hose, FNP  ibuprofen (ADVIL) 200 MG tablet Take 200 mg by mouth every 6 (six) hours as needed for mild pain or  moderate pain.    [provider]  olmesartan-hydrochlorothiazide (BENICAR HCT) 40-12.5 MG tablet Take 1 tablet by mouth daily. 02/14/22   Del Eli Hose, FNP      Allergies    Penicillins    Review of Systems   Review of Systems  Physical Exam Updated Vital Signs BP (!) 154/97 (BP Location: Right Arm)   Pulse 74   Temp 98.2 F (36.8 C) (Oral)   Resp 18   Ht 5' 3"$  (1.6 m)   Wt 111.6 kg   SpO2 99%   BMI 43.58 kg/m  Physical Exam Vitals and nursing note reviewed.  Constitutional:      General: He is not in acute distress.    Appearance: He is well-developed.  HENT:     Head: Normocephalic and atraumatic.     Nose: Nose normal.     Mouth/Throat:     Mouth: Mucous membranes are moist.     Tongue: Tongue does not deviate from midline.     Pharynx: No pharyngeal swelling.  Eyes:     Conjunctiva/sclera: Conjunctivae normal.  Cardiovascular:     Rate and Rhythm: Normal rate and regular rhythm.     Heart sounds: No murmur heard. Pulmonary:     Effort: Pulmonary effort is normal. No respiratory distress.     Breath sounds: Normal breath sounds.  Abdominal:     Palpations: Abdomen is  soft.     Tenderness: There is no abdominal tenderness.  Musculoskeletal:        General: No swelling.     Cervical back: Neck supple.  Skin:    General: Skin is warm and dry.     Capillary Refill: Capillary refill takes less than 2 seconds.  Neurological:     General: No focal deficit present.     Mental Status: He is alert.  Psychiatric:        Mood and Affect: Mood normal.     ED Results / Procedures / Treatments   Labs (all labs ordered are listed, but only abnormal results are displayed) Labs Reviewed - No data to display  EKG None  Radiology No results found.  Procedures Procedures    Medications Ordered in ED Medications  diphenhydrAMINE (BENADRYL) injection 50 mg (50 mg Intravenous Given 03/17/22 1448)  famotidine (PEPCID) IVPB 20 mg premix (0 mg  Intravenous Stopped 03/17/22 1523)  methylPREDNISolone sodium succinate (SOLU-MEDROL) 125 mg/2 mL injection 125 mg (125 mg Intravenous Given 03/17/22 1448)    ED Course/ Medical Decision Making/ A&P                             Medical Decision Making This patient presents to the ED for concern of swelling of eyelids and lower lip after eating a steak sandwich, this involves an extensive number of treatment options, and is a complaint that carries with it a high risk of complications and morbidity.  The differential diagnosis includes angioedema, anaphylaxis, cellulitis, other   Co morbidities that complicate the patient evaluation  History of hypertension   Additional history obtained:  Additional history obtained from EMR External records from outside source obtained and reviewed including outpatient medication list-patient is not on ACE inhibitor        Problem List / ED Course / Critical interventions / Medication management  Angioedema-patient has likely allergic angioedema after eating steak so.  He states swelling started shortly after eating a steak sub and was mild initially and got worse after he ate the other half of the sandwich.  He reports this happened a couple weeks ago after he exposed to some seafood.  He is improving after treatment no worsening, no airway involvement.  No voice changes trouble swallowing or breathing, he was observed for couple of hours and feels good to go home.  He states last time this happened to couple of days to completely resolve while taking antihistamines.  Sent home with steroids.  He already has a follow-up appointment established with his primary care doctor in 2 days.  He is given EpiPen and strict return precautions. I ordered medication including visual, steroids and Pepcid for allergic reaction Reevaluation of the patient after these medicines showed that the patient improved-mildly I have reviewed the patients home medicines and have  made adjustments as needed    Test / Admission - Considered:  Admission but patient is improving though not completely resolved, he has no signs of airway involvement and only has a couple skin so no signs of anaphylaxis, does not need epinephrine for admission.  Patient can manage at home and was given strict return precautions    Risk Prescription drug management.           Final Clinical Impression(s) / ED Diagnoses Final diagnoses:  Allergic angioedema, initial encounter    Rx / DC Orders ED Discharge Orders  Ordered    EPINEPHrine 0.3 mg/0.3 mL IJ SOAJ injection  As needed        03/17/22 1656    predniSONE (DELTASONE) 50 MG tablet  Daily with breakfast        03/17/22 1656    cetirizine (ZYRTEC ALLERGY) 10 MG tablet  2 times daily        03/17/22 [address] 03/17/22 1708    Carmin Muskrat, MD 03/18/22 1504

## 2022-03-17 NOTE — ED Triage Notes (Signed)
Allergic reaction, possibly allergic to mushrooms. Ate them yesterday on a cheesesteak. Both eyes are swollen. Denies shortness of breath or rash at this time.

## 2022-03-19 ENCOUNTER — Ambulatory Visit (INDEPENDENT_AMBULATORY_CARE_PROVIDER_SITE_OTHER): Payer: Medicaid Other | Admitting: Family Medicine

## 2022-03-19 ENCOUNTER — Encounter: Payer: Self-pay | Admitting: Family Medicine

## 2022-03-19 VITALS — BP 140/80 | HR 80 | Ht 63.0 in | Wt 247.0 lb

## 2022-03-19 DIAGNOSIS — Z1211 Encounter for screening for malignant neoplasm of colon: Secondary | ICD-10-CM

## 2022-03-19 DIAGNOSIS — I10 Essential (primary) hypertension: Secondary | ICD-10-CM

## 2022-03-19 MED ORDER — AMLODIPINE BESYLATE 10 MG PO TABS: 10.0000 mg | ORAL_TABLET | Freq: Every day | ORAL | 2 refills | Status: DC

## 2022-03-19 MED ORDER — OLMESARTAN MEDOXOMIL-HCTZ 40-25 MG PO TABS: 1.0000 | ORAL_TABLET | Freq: Every day | ORAL | 2 refills | Status: DC

## 2022-03-19 NOTE — Patient Instructions (Signed)
It was pleasure meeting with you today. Please take medications as prescribed. Follow up with your primary health provider if any health concerns arises. Follow up in 3 months for hypertension management.

## 2022-03-19 NOTE — Progress Notes (Signed)
Patient Office Visit   Subjective   Patient ID: Jason Carpenter, male    DOB: 11-04-1975  Age: 47 y.o. MRN: [id number]  CC:  Chief Complaint  Patient presents with   Hypertension    Patient is here for HTN. Has had no issues with medication.     HPI Jason Carpenter 47 year old male, presents to clinic for hypertension management. He  has a past medical history of Essential hypertension, Heart murmur, and History of asthma.  Patient here for follow-up of elevated blood pressure. He is exercising and is adherent to low salt diet.  Blood pressure is not controlled at home. Cardiac symptoms fatigue. Patient denies chest pain, dyspnea, lower extremity edema, palpitations, syncope, and tachypnea.  Cardiovascular risk factors: hypertension, male gender, obesity (BMI >= 30 kg/m2), and sedentary lifestyle.   Outpatient Encounter Medications as of 03/19/2022  Medication Sig   albuterol (VENTOLIN HFA) 108 (90 Base) MCG/ACT inhaler Inhale 2 puffs into the lungs every 6 (six) hours as needed for wheezing or shortness of breath.   cetirizine (ZYRTEC ALLERGY) 10 MG tablet Take 1 tablet (10 mg total) by mouth 2 (two) times daily.   EPINEPHrine 0.3 mg/0.3 mL IJ SOAJ injection Inject 0.3 mg into the muscle as needed for anaphylaxis.   ibuprofen (ADVIL) 200 MG tablet Take 200 mg by mouth every 6 (six) hours as needed for mild pain or moderate pain.   olmesartan-hydrochlorothiazide (BENICAR HCT) 40-25 MG tablet Take 1 tablet by mouth daily.   predniSONE (DELTASONE) 50 MG tablet Take 1 tablet (50 mg total) by mouth daily with breakfast for 5 days.   [DISCONTINUED] amLODipine (NORVASC) 10 MG tablet Take 1 tablet (10 mg total) by mouth daily.   [DISCONTINUED] olmesartan-hydrochlorothiazide (BENICAR HCT) 40-12.5 MG tablet Take 1 tablet by mouth daily.   amLODipine (NORVASC) 10 MG tablet Take 1 tablet (10 mg total) by mouth daily.   No facility-administered encounter medications on file as of 03/19/2022.     Past Surgical History:  Procedure Laterality Date   EYE SURGERY     HERNIA REPAIR      Review of Systems  Constitutional:  Negative for chills and fever.  Respiratory:  Negative for cough and shortness of breath.   Cardiovascular:  Negative for chest pain, palpitations and leg swelling.  Gastrointestinal:  Negative for abdominal pain, nausea and vomiting.  Neurological:  Negative for dizziness.  Psychiatric/Behavioral:  Negative for depression.       Objective    BP (!) 140/80   Pulse 80   Ht 5' 3"$  (1.6 m)   Wt 247 lb (112 kg)   SpO2 97%   BMI 43.75 kg/m   Physical Exam Vitals reviewed.  Constitutional:      Appearance: He is obese.  Cardiovascular:     Rate and Rhythm: Normal rate.     Pulses: Normal pulses.     Heart sounds: No murmur heard. Pulmonary:     Effort: Pulmonary effort is normal.     Breath sounds: Normal breath sounds.  Musculoskeletal:        General: Normal range of motion.     Right lower leg: No edema.     Left lower leg: No edema.  Skin:    General: Skin is warm.     Capillary Refill: Capillary refill takes less than 2 seconds.  Neurological:     Mental Status: He is alert.     Coordination: Coordination normal.     Gait: Gait normal.  Psychiatric:        Mood and Affect: Mood normal.       Assessment & Plan:  Primary hypertension Assessment & Plan: Blood pressure slightly elevated and has improved since last visit. Vitals:   03/19/22 0849 03/19/22 0915  BP: 124/85 (!) 140/80  Continue amlodipine 10 mg, increased olmesartan-HCTZ 40-25 mg BMP ordered, At home B/P readings 140/90, 137/89, 144/91, 139/87, 137/89 ,139/86 Discussed non pharmacological interventions such as low salt, DASH diet discussed. Educated on stress reduction and physical activity. Discussed signs and symptoms of major cardiovascular event and need to present to the ED. Follow up in 3 months or sooner if needed with blood pressure logs.Patient verbalizes  understanding regarding plan of care and all questions answered.   Orders: -     Basic metabolic panel -     amLODIPine Besylate; Take 1 tablet (10 mg total) by mouth daily.  Dispense: 30 tablet; Refill: 2 -     Olmesartan Medoxomil-HCTZ; Take 1 tablet by mouth daily.  Dispense: 30 tablet; Refill: 2  Screening for colon cancer -     Ambulatory referral to Gastroenterology  Morbid obesity [facility]) Assessment & Plan: Discussed the importance to start eating 3 meals a day including breakfast, drink 8 glasses of water a day ,reduce portion sizes. reduced carbohydrates limit saturated and trans fat, increase servings of vegetables and limit processed foods. Find an activity that you will enjoy and start to be active at least 5 days a week for 30 minutes each day. Keep a food journal or an activity journal to identify triggers that lead to emotional eating. Follow up in 4 weeks       Return in about 3 months (around 06/17/2022) for re-check blood pressure, hypertension.   Renard Hamper Ria Comment, FNP

## 2022-03-19 NOTE — Assessment & Plan Note (Addendum)
Blood pressure slightly elevated and has improved since last visit. Vitals:   03/19/22 0849 03/19/22 0915  BP: 124/85 (!) 140/80  Continue amlodipine 10 mg, increased olmesartan-HCTZ 40-25 mg BMP ordered, At home B/P readings 140/90, 137/89, 144/91, 139/87, 137/89 ,139/86 Discussed non pharmacological interventions such as low salt, DASH diet discussed. Educated on stress reduction and physical activity. Discussed signs and symptoms of major cardiovascular event and need to present to the ED. Follow up in 3 months or sooner if needed with blood pressure logs.Patient verbalizes understanding regarding plan of care and all questions answered.

## 2022-03-19 NOTE — Assessment & Plan Note (Addendum)
Discussed the importance to start eating 3 meals a day including breakfast, drink 8 glasses of water a day ,reduce portion sizes. reduced carbohydrates limit saturated and trans fat, increase servings of vegetables and limit processed foods. Find an activity that you will enjoy and start to be active at least 5 days a week for 30 minutes each day. Keep a food journal or an activity journal to identify triggers that lead to emotional eating. Follow up in 4 weeks

## 2022-03-20 LAB — BASIC METABOLIC PANEL
BUN/Creatinine Ratio: 28 — ABNORMAL HIGH (ref 9–20)
BUN: 25 mg/dL — ABNORMAL HIGH (ref 6–24)
CO2: 20 mmol/L (ref 20–29)
Calcium: 9 mg/dL (ref 8.7–10.2)
Chloride: 104 mmol/L (ref 96–106)
Creatinine, Ser: 0.89 mg/dL (ref 0.76–1.27)
Glucose: 116 mg/dL — ABNORMAL HIGH (ref 70–99)
Potassium: 4.1 mmol/L (ref 3.5–5.2)
Sodium: 140 mmol/L (ref 134–144)
eGFR: 106 mL/min/{1.73_m2} (ref >59)

## 2022-03-21 ENCOUNTER — Encounter: Payer: Self-pay | Admitting: *Deleted

## 2022-03-29 DIAGNOSIS — Z419 Encounter for procedure for purposes other than remedying health state, unspecified: Secondary | ICD-10-CM | POA: Diagnosis not present

## 2022-04-29 DIAGNOSIS — Z419 Encounter for procedure for purposes other than remedying health state, unspecified: Secondary | ICD-10-CM | POA: Diagnosis not present

## 2022-05-29 DIAGNOSIS — Z419 Encounter for procedure for purposes other than remedying health state, unspecified: Secondary | ICD-10-CM | POA: Diagnosis not present

## 2022-06-18 ENCOUNTER — Ambulatory Visit (INDEPENDENT_AMBULATORY_CARE_PROVIDER_SITE_OTHER): Payer: Medicaid Other | Admitting: Family Medicine

## 2022-06-18 ENCOUNTER — Encounter: Payer: Self-pay | Admitting: Family Medicine

## 2022-06-18 ENCOUNTER — Ambulatory Visit: Payer: Medicaid Other | Admitting: Family Medicine

## 2022-06-18 VITALS — BP 150/82

## 2022-06-18 DIAGNOSIS — I10 Essential (primary) hypertension: Secondary | ICD-10-CM

## 2022-06-18 DIAGNOSIS — Z131 Encounter for screening for diabetes mellitus: Secondary | ICD-10-CM

## 2022-06-18 DIAGNOSIS — G629 Polyneuropathy, unspecified: Secondary | ICD-10-CM | POA: Insufficient documentation

## 2022-06-18 DIAGNOSIS — E559 Vitamin D deficiency, unspecified: Secondary | ICD-10-CM | POA: Diagnosis not present

## 2022-06-18 DIAGNOSIS — Z13220 Encounter for screening for lipoid disorders: Secondary | ICD-10-CM | POA: Diagnosis not present

## 2022-06-18 DIAGNOSIS — G589 Mononeuropathy, unspecified: Secondary | ICD-10-CM

## 2022-06-18 MED ORDER — GABAPENTIN 300 MG PO CAPS: 300.0000 mg | ORAL_CAPSULE | Freq: Three times a day (TID) | ORAL | 1 refills | Status: DC | PRN

## 2022-06-18 NOTE — Assessment & Plan Note (Signed)
Trial on Gabapentin 300 mg PRN Explained to patient Non pharmacological interventions include  recommend range of motion exercises, gentle stretching. Follow up for worsening or persistent symptoms. Patientverbalizes understanding regarding plan of care and all questions answered.

## 2022-06-18 NOTE — Patient Instructions (Signed)

## 2022-06-18 NOTE — Assessment & Plan Note (Signed)
Vitals:   06/18/22 0858 06/18/22 0859  BP: (!) 165/88 (!) 150/82  Blood Pressure not controlled in today's visit. Follow up in 2 weeks Patient reported has not been compliant on his diet with spicy foods and takes olmesartan-HCTZ 40-25 mg daily and amlodipine 10 mg daily Continued discussion on DASH diet, low sodium diet and maintain a exercise routine for 150 minutes per week.

## 2022-06-18 NOTE — Progress Notes (Signed)
Patient Office Visit   Subjective   Patient ID: Jason Carpenter, male    DOB: 05/29/1975  Age: 47 y.o. MRN: [id number]  CC: No chief complaint on file.   HPI Jason Carpenter 47 year old male, presents to the clinic for HTN follow up He  has a past medical history of Essential hypertension, Heart murmur, and History of asthma.  Hypertension This is a recurrent problem. The problem has been gradually worsening since onset. The problem is uncontrolled. Associated symptoms include palpitations. Pertinent negatives include no blurred vision or chest pain. Risk factors for coronary artery disease include dyslipidemia and obesity. Past treatments include diuretics, calcium channel blockers and angiotensin blockers. The current treatment provides mild improvement. Compliance problems include diet.    Arm Pain  The incident occurred more than a month ago. There was no injury mechanism. The pain is present in the right forearm. The quality of the pain is described as aching. The pain is at a severity of 6/10. The pain has been constant since the incident. Associated symptoms include numbness and tingling radiates to right fingers. Pertinent negatives include no chest pain. The symptoms are aggravated by movement. He has tried acetaminophen for the symptoms. The treatment provided no relief.        Outpatient Encounter Medications as of 06/18/2022  Medication Sig   gabapentin (NEURONTIN) 300 MG capsule Take 1 capsule (300 mg total) by mouth 3 (three) times daily as needed.   albuterol (VENTOLIN HFA) 108 (90 Base) MCG/ACT inhaler Inhale 2 puffs into the lungs every 6 (six) hours as needed for wheezing or shortness of breath.   amLODipine (NORVASC) 10 MG tablet Take 1 tablet (10 mg total) by mouth daily.   cetirizine (ZYRTEC ALLERGY) 10 MG tablet Take 1 tablet (10 mg total) by mouth 2 (two) times daily.   EPINEPHrine 0.3 mg/0.3 mL IJ SOAJ injection Inject 0.3 mg into the muscle as needed for  anaphylaxis.   ibuprofen (ADVIL) 200 MG tablet Take 200 mg by mouth every 6 (six) hours as needed for mild pain or moderate pain.   olmesartan-hydrochlorothiazide (BENICAR HCT) 40-25 MG tablet Take 1 tablet by mouth daily.   No facility-administered encounter medications on file as of 06/18/2022.    Past Surgical History:  Procedure Laterality Date   EYE SURGERY     HERNIA REPAIR      Review of Systems  Constitutional:  Negative for chills and fever.  Eyes:  Negative for blurred vision.  Respiratory:  Negative for shortness of breath.   Cardiovascular:  Negative for chest pain.  Gastrointestinal:  Negative for abdominal pain and vomiting.  Genitourinary:  Negative for dysuria.  Musculoskeletal:  Positive for joint pain and myalgias.  Skin:  Negative for rash.  Neurological:  Positive for tingling.      Objective    BP (!) 150/82   Physical Exam Vitals reviewed.  Constitutional:      General: He is not in acute distress.    Appearance: Normal appearance. He is not ill-appearing, toxic-appearing or diaphoretic.  HENT:     Head: Normocephalic.  Eyes:     General:        Right eye: No discharge.        Left eye: No discharge.     Conjunctiva/sclera: Conjunctivae normal.  Cardiovascular:     Rate and Rhythm: Normal rate.     Pulses: Normal pulses.     Heart sounds: Normal heart sounds.  Pulmonary:  Effort: Pulmonary effort is normal. No respiratory distress.     Breath sounds: Normal breath sounds.  Abdominal:     General: Bowel sounds are normal.     Palpations: Abdomen is soft.  Musculoskeletal:        General: Normal range of motion.     Cervical back: Normal range of motion.  Skin:    General: Skin is warm and dry.     Capillary Refill: Capillary refill takes less than 2 seconds.  Neurological:     General: No focal deficit present.     Mental Status: He is alert and oriented to person, place, and time.     Coordination: Coordination normal.     Gait:  Gait normal.  Psychiatric:        Mood and Affect: Mood normal.        Behavior: Behavior normal.       Assessment & Plan:  Primary hypertension Assessment & Plan: Vitals:   06/18/22 0858 06/18/22 0859  BP: (!) 165/88 (!) 150/82  Blood Pressure not controlled in today's visit. Follow up in 2 weeks Patient reported has not been compliant on his diet with spicy foods and takes olmesartan-HCTZ 40-25 mg daily and amlodipine 10 mg daily Continued discussion on DASH diet, low sodium diet and maintain a exercise routine for 150 minutes per week.   Orders: -     Microalbumin / creatinine urine ratio -     CMP14+EGFR -     CBC with Differential/Platelet  Vitamin D deficiency -     VITAMIN D 25 Hydroxy (Vit-D Deficiency, Fractures)  Screening for lipid disorders -     Lipid panel  Screening for diabetes mellitus -     Hemoglobin A1c  Mononeuropathy Assessment & Plan: Trial on Gabapentin 300 mg PRN Explained to patient Non pharmacological interventions include  recommend range of motion exercises, gentle stretching. Follow up for worsening or persistent symptoms. Patientverbalizes understanding regarding plan of care and all questions answered.     Other orders -     Gabapentin; Take 1 capsule (300 mg total) by mouth 3 (three) times daily as needed.  Dispense: 90 capsule; Refill: 1    Return in about 2 weeks (around 07/02/2022) for hypertension, re-check blood pressure, arm pain.   Cruzita Lederer Newman Nip, FNP

## 2022-06-27 ENCOUNTER — Ambulatory Visit (INDEPENDENT_AMBULATORY_CARE_PROVIDER_SITE_OTHER): Payer: Medicaid Other | Admitting: Family Medicine

## 2022-06-27 ENCOUNTER — Encounter: Payer: Self-pay | Admitting: Family Medicine

## 2022-06-27 VITALS — BP 150/86 | HR 94 | Ht 63.0 in | Wt 249.0 lb

## 2022-06-27 DIAGNOSIS — I10 Essential (primary) hypertension: Secondary | ICD-10-CM | POA: Diagnosis not present

## 2022-06-27 DIAGNOSIS — E559 Vitamin D deficiency, unspecified: Secondary | ICD-10-CM | POA: Diagnosis not present

## 2022-06-27 DIAGNOSIS — Z131 Encounter for screening for diabetes mellitus: Secondary | ICD-10-CM | POA: Diagnosis not present

## 2022-06-27 DIAGNOSIS — Z13220 Encounter for screening for lipoid disorders: Secondary | ICD-10-CM | POA: Diagnosis not present

## 2022-06-27 DIAGNOSIS — Z23 Encounter for immunization: Secondary | ICD-10-CM

## 2022-06-27 MED ORDER — OLMESARTAN MEDOXOMIL-HCTZ 40-25 MG PO TABS
1.0000 | ORAL_TABLET | Freq: Every day | ORAL | 2 refills | Status: DC
Start: 2022-06-27 — End: 2023-12-19

## 2022-06-27 MED ORDER — AMLODIPINE BESYLATE 10 MG PO TABS
10.0000 mg | ORAL_TABLET | Freq: Every day | ORAL | 2 refills | Status: DC
Start: 2022-06-27 — End: 2023-12-19

## 2022-06-27 MED ORDER — METOPROLOL TARTRATE 25 MG PO TABS: 25.0000 mg | ORAL_TABLET | Freq: Two times a day (BID) | ORAL | 3 refills | Status: DC

## 2022-06-27 NOTE — Assessment & Plan Note (Signed)
Vitals:   06/27/22 0951 06/27/22 0952  BP: (!) 163/86 (!) 150/86  Blood pressure not controlled in today's visit Started metoprolol 25 mg twice daily Continue olmesartan-HCTZ 40-25 mg daily and amlodipine 10 mg daily Follow up in 6 weeks      Explained non pharmacological interventions such as low salt, DASH diet discussed. Educated on stress reduction and physical activity minimum 150 minutes per week. Discussed signs and symptoms of major cardiovascular event and need to present to the ED.  Patient verbalizes understanding regarding plan of care and all questions answered.

## 2022-06-27 NOTE — Progress Notes (Signed)
Patient Office Visit   Subjective   Patient ID: Jason Carpenter, male    DOB: 03/10/1975  Age: 47 y.o. MRN: [id number]  CC:  Chief Complaint  Patient presents with   Hypertension    Patient is here for HTN f/u. Has no concerns or complaints since last visit.     HPI Jason Carpenter 47 year old male, presents to the clinic for HTN follow up. He  has a past medical history of Essential hypertension, Heart murmur, and History of asthma.For the details of today's visit, please refer to assessment and plan.   HPI    Outpatient Encounter Medications as of 06/27/2022  Medication Sig   albuterol (VENTOLIN HFA) 108 (90 Base) MCG/ACT inhaler Inhale 2 puffs into the lungs every 6 (six) hours as needed for wheezing or shortness of breath.   cetirizine (ZYRTEC ALLERGY) 10 MG tablet Take 1 tablet (10 mg total) by mouth 2 (two) times daily.   EPINEPHrine 0.3 mg/0.3 mL IJ SOAJ injection Inject 0.3 mg into the muscle as needed for anaphylaxis.   gabapentin (NEURONTIN) 300 MG capsule Take 1 capsule (300 mg total) by mouth 3 (three) times daily as needed.   ibuprofen (ADVIL) 200 MG tablet Take 200 mg by mouth every 6 (six) hours as needed for mild pain or moderate pain.   metoprolol tartrate (LOPRESSOR) 25 MG tablet Take 1 tablet (25 mg total) by mouth 2 (two) times daily.   [DISCONTINUED] amLODipine (NORVASC) 10 MG tablet Take 1 tablet (10 mg total) by mouth daily.   [DISCONTINUED] olmesartan-hydrochlorothiazide (BENICAR HCT) 40-25 MG tablet Take 1 tablet by mouth daily.   amLODipine (NORVASC) 10 MG tablet Take 1 tablet (10 mg total) by mouth daily.   olmesartan-hydrochlorothiazide (BENICAR HCT) 40-25 MG tablet Take 1 tablet by mouth daily.   No facility-administered encounter medications on file as of 06/27/2022.    Past Surgical History:  Procedure Laterality Date   EYE SURGERY     HERNIA REPAIR      Review of Systems  Constitutional:  Negative for chills and fever.  Eyes:  Negative for  blurred vision.  Respiratory:  Negative for cough.   Cardiovascular:  Negative for chest pain.  Gastrointestinal:  Negative for abdominal pain and vomiting.  Genitourinary:  Negative for dysuria.  Musculoskeletal:  Negative for myalgias.  Neurological:  Negative for dizziness and headaches.      Objective    BP (!) 150/86   Pulse 94   Ht 5\' 3"  (1.6 m)   Wt 249 lb (112.9 kg)   SpO2 95%   BMI 44.11 kg/m   Physical Exam Vitals reviewed.  Constitutional:      General: He is not in acute distress.    Appearance: Normal appearance. He is not ill-appearing, toxic-appearing or diaphoretic.  HENT:     Head: Normocephalic.  Eyes:     General:        Right eye: No discharge.        Left eye: No discharge.     Conjunctiva/sclera: Conjunctivae normal.  Cardiovascular:     Rate and Rhythm: Normal rate.     Pulses: Normal pulses.     Heart sounds: Normal heart sounds.  Pulmonary:     Effort: Pulmonary effort is normal. No respiratory distress.     Breath sounds: Normal breath sounds.  Abdominal:     Tenderness: There is no abdominal tenderness. There is no left CVA tenderness.  Musculoskeletal:  General: Normal range of motion.     Cervical back: Normal range of motion.  Skin:    General: Skin is warm and dry.     Capillary Refill: Capillary refill takes less than 2 seconds.  Neurological:     General: No focal deficit present.     Mental Status: He is alert and oriented to person, place, and time.     Coordination: Coordination normal.     Gait: Gait normal.  Psychiatric:        Mood and Affect: Mood normal.        Behavior: Behavior normal.       Assessment & Plan:  Primary hypertension Assessment & Plan: Vitals:   06/27/22 0951 06/27/22 0952  BP: (!) 163/86 (!) 150/86  Blood pressure not controlled in today's visit Started metoprolol 25 mg twice daily Continue olmesartan-HCTZ 40-25 mg daily and amlodipine 10 mg daily Follow up in 6 weeks      Explained non  pharmacological interventions such as low salt, DASH diet discussed. Educated on stress reduction and physical activity minimum 150 minutes per week. Discussed signs and symptoms of major cardiovascular event and need to present to the ED.  Patient verbalizes understanding regarding plan of care and all questions answered.   Orders: -     Olmesartan Medoxomil-HCTZ; Take 1 tablet by mouth daily.  Dispense: 90 tablet; Refill: 2 -     amLODIPine Besylate; Take 1 tablet (10 mg total) by mouth daily.  Dispense: 90 tablet; Refill: 2  Need for Tdap vaccination -     Tdap vaccine greater than or equal to 7yo IM  Other orders -     Metoprolol Tartrate; Take 1 tablet (25 mg total) by mouth 2 (two) times daily.  Dispense: 90 tablet; Refill: 3    Return in about 6 weeks (around 08/08/2022) for hypertension, re-check blood pressure, nerve pain.   Cruzita Lederer Newman Nip, FNP

## 2022-06-27 NOTE — Patient Instructions (Signed)

## 2022-06-28 ENCOUNTER — Other Ambulatory Visit: Payer: Self-pay | Admitting: Family Medicine

## 2022-06-28 DIAGNOSIS — D509 Iron deficiency anemia, unspecified: Secondary | ICD-10-CM

## 2022-06-28 MED ORDER — ROSUVASTATIN CALCIUM 10 MG PO TABS
10.0000 mg | ORAL_TABLET | Freq: Every day | ORAL | 3 refills | Status: DC
Start: 2022-06-28 — End: 2023-12-19

## 2022-06-28 NOTE — Progress Notes (Signed)
Please inform patient,  Vitamin D levels low, I advise to taking  over the counter supplements of vitamin D 1000 IU/day to prevent low vitamin D levels. Consuming Vitamin D rich food sources include fish, salmon, sardines, egg yolks, red meat, liver, oranges, soy milk.    Cholesterol levels elevated, start lifestyle modifications follow diet low in saturated fat, reduce dietary salt intake, avoid fatty foods, maintain an exercise routine 3 to 5 days a week for a minimum total of 150 minutes.  Plan of treatment will include Crestor 10 mg daily- medication sent to pharmacy  Hemoglobin A1c 6.3 indicates prediabetes - no medication intervention just lifestyle changes  It is important to follow a DASH diet which includes vegetables,fruits,whole grains, fat free or low fat diary,fish,poultry,beans,nuts and seeds,vegetable oils. Find an activity that you will enjoyandstart to be active at least 5 days a week for 30 minutes each day.  Repeat labs in 3 months, if levels increase medication is required for type 2 diabetes     @NAME   CBC panel low, may indicate iron deficiency anemia. I advise to consume iron rich foods such as Red meat, pork, poultry, seafood, beans, dark green leafy vegetables, such as spinach, dried fruit, such as raisins and apricots. Iron-fortified cereals, and peas. I encourage to take over the counter iron tablets 65 mg once daily. In our next visit we'll draw recheck labs screen for iron deficiency anemia.

## 2022-06-28 NOTE — Progress Notes (Unsigned)
Error

## 2022-06-29 DIAGNOSIS — Z419 Encounter for procedure for purposes other than remedying health state, unspecified: Secondary | ICD-10-CM | POA: Diagnosis not present

## 2022-06-29 LAB — LIPID PANEL
Chol/HDL Ratio: 4.1 ratio (ref 0.0–5.0)
Cholesterol, Total: 176 mg/dL (ref 100–199)
HDL: 43 mg/dL (ref >39)
LDL Chol Calc (NIH): 122 mg/dL — ABNORMAL HIGH (ref 0–99)
Triglycerides: 56 mg/dL (ref 0–149)
VLDL Cholesterol Cal: 11 mg/dL (ref 5–40)

## 2022-06-29 LAB — CBC WITH DIFFERENTIAL/PLATELET
Basophils Absolute: 0 10*3/uL (ref 0.0–0.2)
Basos: 0 %
EOS (ABSOLUTE): 0.2 10*3/uL (ref 0.0–0.4)
Eos: 2 %
Hematocrit: 42.8 % (ref 37.5–51.0)
Hemoglobin: 14.4 g/dL (ref 13.0–17.7)
Immature Grans (Abs): 0 10*3/uL (ref 0.0–0.1)
Immature Granulocytes: 0 %
Lymphocytes Absolute: 1.4 10*3/uL (ref 0.7–3.1)
Lymphs: 15 %
MCH: 24.4 pg — ABNORMAL LOW (ref 26.6–33.0)
MCHC: 33.6 g/dL (ref 31.5–35.7)
MCV: 72 fL — ABNORMAL LOW (ref 79–97)
Monocytes Absolute: 0.7 10*3/uL (ref 0.1–0.9)
Monocytes: 7 %
Neutrophils Absolute: 6.8 10*3/uL (ref 1.4–7.0)
Neutrophils: 76 %
Platelets: 246 10*3/uL (ref 150–450)
RBC: 5.91 x10E6/uL — ABNORMAL HIGH (ref 4.14–5.80)
RDW: 15.8 % — ABNORMAL HIGH (ref 11.6–15.4)
WBC: 9.1 10*3/uL (ref 3.4–10.8)

## 2022-06-29 LAB — CMP14+EGFR
ALT: 14 IU/L (ref 0–44)
AST: 13 IU/L (ref 0–40)
Albumin/Globulin Ratio: 1.6 (ref 1.2–2.2)
Albumin: 4.7 g/dL (ref 4.1–5.1)
Alkaline Phosphatase: 150 IU/L — ABNORMAL HIGH (ref 44–121)
BUN/Creatinine Ratio: 21 — ABNORMAL HIGH (ref 9–20)
BUN: 14 mg/dL (ref 6–24)
Bilirubin Total: 0.5 mg/dL (ref 0.0–1.2)
CO2: 21 mmol/L (ref 20–29)
Calcium: 9.7 mg/dL (ref 8.7–10.2)
Chloride: 104 mmol/L (ref 96–106)
Creatinine, Ser: 0.67 mg/dL — ABNORMAL LOW (ref 0.76–1.27)
Globulin, Total: 3 g/dL (ref 1.5–4.5)
Glucose: 100 mg/dL — ABNORMAL HIGH (ref 70–99)
Potassium: 4 mmol/L (ref 3.5–5.2)
Sodium: 140 mmol/L (ref 134–144)
Total Protein: 7.7 g/dL (ref 6.0–8.5)
eGFR: 115 mL/min/{1.73_m2} (ref >59)

## 2022-06-29 LAB — VITAMIN D 25 HYDROXY (VIT D DEFICIENCY, FRACTURES): Vit D, 25-Hydroxy: 14.1 ng/mL — ABNORMAL LOW (ref 30.0–100.0)

## 2022-06-29 LAB — HEMOGLOBIN A1C
Est. average glucose Bld gHb Est-mCnc: 134 mg/dL
Hgb A1c MFr Bld: 6.3 % — ABNORMAL HIGH (ref 4.8–5.6)

## 2022-06-29 LAB — MICROALBUMIN / CREATININE URINE RATIO
Creatinine, Urine: 54.3 mg/dL
Microalb/Creat Ratio: <6 mg/g creat (ref 0–29)
Microalbumin, Urine: <3 ug/mL

## 2022-07-29 DIAGNOSIS — Z419 Encounter for procedure for purposes other than remedying health state, unspecified: Secondary | ICD-10-CM | POA: Diagnosis not present

## 2022-08-08 ENCOUNTER — Ambulatory Visit: Payer: Medicaid Other | Admitting: Family Medicine

## 2022-08-29 DIAGNOSIS — Z419 Encounter for procedure for purposes other than remedying health state, unspecified: Secondary | ICD-10-CM | POA: Diagnosis not present

## 2022-09-03 ENCOUNTER — Encounter: Payer: Self-pay | Admitting: *Deleted

## 2022-09-29 DIAGNOSIS — Z419 Encounter for procedure for purposes other than remedying health state, unspecified: Secondary | ICD-10-CM | POA: Diagnosis not present

## 2022-10-29 DIAGNOSIS — Z419 Encounter for procedure for purposes other than remedying health state, unspecified: Secondary | ICD-10-CM | POA: Diagnosis not present

## 2022-11-29 DIAGNOSIS — Z419 Encounter for procedure for purposes other than remedying health state, unspecified: Secondary | ICD-10-CM | POA: Diagnosis not present

## 2022-12-29 DIAGNOSIS — Z419 Encounter for procedure for purposes other than remedying health state, unspecified: Secondary | ICD-10-CM | POA: Diagnosis not present

## 2023-01-29 DIAGNOSIS — Z419 Encounter for procedure for purposes other than remedying health state, unspecified: Secondary | ICD-10-CM | POA: Diagnosis not present

## 2023-03-01 DIAGNOSIS — Z419 Encounter for procedure for purposes other than remedying health state, unspecified: Secondary | ICD-10-CM | POA: Diagnosis not present

## 2023-03-29 DIAGNOSIS — Z419 Encounter for procedure for purposes other than remedying health state, unspecified: Secondary | ICD-10-CM | POA: Diagnosis not present

## 2023-04-14 ENCOUNTER — Encounter (HOSPITAL_COMMUNITY): Payer: Self-pay

## 2023-04-14 ENCOUNTER — Emergency Department (HOSPITAL_COMMUNITY)

## 2023-04-14 ENCOUNTER — Emergency Department (HOSPITAL_COMMUNITY)
Admission: EM | Admit: 2023-04-14 | Discharge: 2023-04-14 | Disposition: A | Discharge status: Home / Self Care | Attending: Emergency Medicine | Admitting: Emergency Medicine

## 2023-04-14 ENCOUNTER — Other Ambulatory Visit: Payer: Self-pay

## 2023-04-14 DIAGNOSIS — I10 Essential (primary) hypertension: Secondary | ICD-10-CM | POA: Insufficient documentation

## 2023-04-14 DIAGNOSIS — J45909 Unspecified asthma, uncomplicated: Secondary | ICD-10-CM | POA: Diagnosis not present

## 2023-04-14 DIAGNOSIS — R002 Palpitations: Secondary | ICD-10-CM | POA: Insufficient documentation

## 2023-04-14 DIAGNOSIS — Z79899 Other long term (current) drug therapy: Secondary | ICD-10-CM | POA: Diagnosis not present

## 2023-04-14 DIAGNOSIS — R0989 Other specified symptoms and signs involving the circulatory and respiratory systems: Secondary | ICD-10-CM | POA: Diagnosis not present

## 2023-04-14 DIAGNOSIS — R079 Chest pain, unspecified: Secondary | ICD-10-CM | POA: Diagnosis not present

## 2023-04-14 LAB — COMPREHENSIVE METABOLIC PANEL
ALT: 12 U/L (ref 0–44)
AST: 15 U/L (ref 15–41)
Albumin: 3.9 g/dL (ref 3.5–5.0)
Alkaline Phosphatase: 96 U/L (ref 38–126)
Anion gap: 8 (ref 5–15)
BUN: 11 mg/dL (ref 6–20)
CO2: 22 mmol/L (ref 22–32)
Calcium: 8.9 mg/dL (ref 8.9–10.3)
Chloride: 108 mmol/L (ref 98–111)
Creatinine, Ser: 0.67 mg/dL (ref 0.61–1.24)
GFR, Estimated: >60 mL/min (ref >60)
Glucose, Bld: 113 mg/dL — ABNORMAL HIGH (ref 70–99)
Potassium: 3.6 mmol/L (ref 3.5–5.1)
Sodium: 138 mmol/L (ref 135–145)
Total Bilirubin: 0.5 mg/dL (ref 0.0–1.2)
Total Protein: 7.2 g/dL (ref 6.5–8.1)

## 2023-04-14 LAB — CBC WITH DIFFERENTIAL/PLATELET
Abs Immature Granulocytes: 0.02 10*3/uL (ref 0.00–0.07)
Basophils Absolute: 0.1 10*3/uL (ref 0.0–0.1)
Basophils Relative: 1 %
Eosinophils Absolute: 0.2 10*3/uL (ref 0.0–0.5)
Eosinophils Relative: 3 %
HCT: 40.8 % (ref 39.0–52.0)
Hemoglobin: 13.5 g/dL (ref 13.0–17.0)
Immature Granulocytes: 0 %
Lymphocytes Relative: 15 %
Lymphs Abs: 1.1 10*3/uL (ref 0.7–4.0)
MCH: 25.8 pg — ABNORMAL LOW (ref 26.0–34.0)
MCHC: 33.1 g/dL (ref 30.0–36.0)
MCV: 77.9 fL — ABNORMAL LOW (ref 80.0–100.0)
Monocytes Absolute: 0.6 10*3/uL (ref 0.1–1.0)
Monocytes Relative: 8 %
Neutro Abs: 5.6 10*3/uL (ref 1.7–7.7)
Neutrophils Relative %: 73 %
Platelets: 252 10*3/uL (ref 150–400)
RBC: 5.24 MIL/uL (ref 4.22–5.81)
RDW: 14.6 % (ref 11.5–15.5)
WBC: 7.6 10*3/uL (ref 4.0–10.5)
nRBC: 0 % (ref 0.0–0.2)

## 2023-04-14 LAB — TROPONIN I (HIGH SENSITIVITY)
Troponin I (High Sensitivity): 2 ng/L (ref <18)
Troponin I (High Sensitivity): 2 ng/L (ref <18)

## 2023-04-14 MED ORDER — METOCLOPRAMIDE HCL 5 MG/ML IJ SOLN
10.0000 mg | Freq: Once | INTRAMUSCULAR | Status: AC
  Administered 2023-04-14: 10 mg via INTRAVENOUS
  Filled 2023-04-14: qty 2

## 2023-04-14 MED ORDER — DIPHENHYDRAMINE HCL 50 MG/ML IJ SOLN
12.5000 mg | Freq: Once | INTRAMUSCULAR | Status: AC
  Administered 2023-04-14: 12.5 mg via INTRAVENOUS
  Filled 2023-04-14: qty 1

## 2023-04-14 MED ORDER — KETOROLAC TROMETHAMINE 15 MG/ML IJ SOLN
15.0000 mg | Freq: Once | INTRAMUSCULAR | Status: AC
  Administered 2023-04-14: 15 mg via INTRAVENOUS
  Filled 2023-04-14: qty 1

## 2023-04-14 MED ORDER — SODIUM CHLORIDE 0.9 % IV BOLUS
1000.0000 mL | Freq: Once | INTRAVENOUS | Status: AC
  Administered 2023-04-14: 1000 mL via INTRAVENOUS

## 2023-04-14 NOTE — Discharge Instructions (Addendum)
 As discussed, your labs and imaging are reassuring.  There is no sign of injury to your heart muscle.  I have sent a referral for cardiology follow-up with for further evaluation of your palpitations.  They should call you in the neck several days schedule appointment.  In the meantime, follow-up with your primary care in a week for reevaluation of your symptoms.  Get help right away if: You have chest pain. You feel short of breath. You have a very bad headache. You feel dizzy. You faint.

## 2023-04-14 NOTE — ED Triage Notes (Signed)
 Pt arrived via POV c/o palpitations that have been off and on for past month. Pt reports headache X 3 days as  well. Pt also endorses more fatigue than usual.

## 2023-04-14 NOTE — ED Notes (Signed)
 Patient Alert and oriented to baseline. Stable and ambulatory to baseline. Patient verbalized understanding of the discharge instructions.  Patient belongings were taken by the patient.

## 2023-04-14 NOTE — ED Provider Notes (Signed)
 [place] EMERGENCY DEPARTMENT AT [facility] Provider Note   CSN: [id number] Arrival date & time: 04/14/23  0915     History  Chief Complaint  Patient presents with   Palpitations    Jason Carpenter is a 48 y.o. male with a history of asthma and hypertension who presents the ED today for palpitation.  Patient reports intermittent palpitations for the past month follows a headache for the past 3 days.  States that he was having some chest pain shortness of breath this morning so decided come in for further evaluation.  Additionally, he reports feeling more fatigued than usual.  States that he has some mild discomfort to the center of his chest.  Pain is not worse with deep breaths or with movement.  Patient states that he is seeing a cardiologist once in the past but has not followed up in several years.  Denies recent travel, surgeries, unilateral leg swelling, or history of coagulopathies.  No additional complaints or concerns at this time.    Home Medications Prior to Admission medications   Medication Sig Start Date End Date Taking? Authorizing Provider  albuterol (VENTOLIN HFA) 108 (90 Base) MCG/ACT inhaler Inhale 2 puffs into the lungs every 6 (six) hours as needed for wheezing or shortness of breath. 02/14/22   Del Nigel Berthold, FNP  amLODipine (NORVASC) 10 MG tablet Take 1 tablet (10 mg total) by mouth daily. 06/27/22   Del Nigel Berthold, FNP  cetirizine (ZYRTEC ALLERGY) 10 MG tablet Take 1 tablet (10 mg total) by mouth 2 (two) times daily. 03/17/22   Carmel Sacramento A, PA-C  EPINEPHrine 0.3 mg/0.3 mL IJ SOAJ injection Inject 0.3 mg into the muscle as needed for anaphylaxis. 03/17/22   Carmel Sacramento A, PA-C  gabapentin (NEURONTIN) 300 MG capsule Take 1 capsule (300 mg total) by mouth 3 (three) times daily as needed. 06/18/22   Del Nigel Berthold, FNP  ibuprofen (ADVIL) 200 MG tablet Take 200 mg by mouth every 6 (six) hours as needed for mild pain or  moderate pain.    [provider]  metoprolol tartrate (LOPRESSOR) 25 MG tablet Take 1 tablet (25 mg total) by mouth 2 (two) times daily. 06/27/22   Del Nigel Berthold, FNP  olmesartan-hydrochlorothiazide (BENICAR HCT) 40-25 MG tablet Take 1 tablet by mouth daily. 06/27/22   Del Nigel Berthold, FNP  rosuvastatin (CRESTOR) 10 MG tablet Take 1 tablet (10 mg total) by mouth daily. 06/28/22   Del Nigel Berthold, FNP      Allergies    Penicillins    Review of Systems   Review of Systems  Cardiovascular:  Positive for palpitations.  All other systems reviewed and are negative.   Physical Exam Updated Vital Signs BP (!) 131/90   Pulse 81   Temp 98.5 F (36.9 C) (Oral)   Resp (!) 21   Ht 5\' 3"  (1.6 m)   Wt 113 kg   SpO2 96%   BMI 44.13 kg/m  Physical Exam Vitals and nursing note reviewed.  Constitutional:      General: He is not in acute distress.    Appearance: Normal appearance.  HENT:     Head: Normocephalic and atraumatic.     Mouth/Throat:     Mouth: Mucous membranes are moist.  Eyes:     Conjunctiva/sclera: Conjunctivae normal.     Pupils: Pupils are equal, round, and reactive to light.  Cardiovascular:     Rate and Rhythm: Normal rate  and regular rhythm.     Pulses: Normal pulses.     Heart sounds: Normal heart sounds.  Pulmonary:     Effort: Pulmonary effort is normal.     Breath sounds: Normal breath sounds.  Abdominal:     Palpations: Abdomen is soft.     Tenderness: There is no abdominal tenderness.  Musculoskeletal:        General: No tenderness or deformity. Normal range of motion.     Cervical back: Normal range of motion.  Skin:    General: Skin is warm and dry.     Findings: No rash.  Neurological:     General: No focal deficit present.     Mental Status: He is alert.  Psychiatric:        Mood and Affect: Mood normal.        Behavior: Behavior normal.    ED Results / Procedures / Treatments   Labs (all labs ordered are  listed, but only abnormal results are displayed) Labs Reviewed  COMPREHENSIVE METABOLIC PANEL - Abnormal; Notable for the following components:      Result Value   Glucose, Bld 113 (*)    All other components within normal limits  CBC WITH DIFFERENTIAL/PLATELET - Abnormal; Notable for the following components:   MCV 77.9 (*)    MCH 25.8 (*)    All other components within normal limits  TROPONIN I (HIGH SENSITIVITY)  TROPONIN I (HIGH SENSITIVITY)    EKG EKG Interpretation Date/Time:  Monday April 14 2023 09:33:13 EDT Ventricular Rate:  82 PR Interval:  166 QRS Duration:  107 QT Interval:  394 QTC Calculation: 461 R Axis:   0  Text Interpretation: Sinus rhythm Confirmed by Vonita Moss [phone]) on 04/14/2023 9:37:57 AM  Radiology DG Chest 2 View Result Date: 04/14/2023 CLINICAL DATA:  Chest pain. EXAM: CHEST - 2 VIEW COMPARISON:  None Available. FINDINGS: Low lung volume. Mild diffuse pulmonary vascular congestion is most likely secondary to low lung volume. No frank pulmonary edema. Bilateral lung fields are clear. No dense consolidation or lung collapse. Bilateral costophrenic angles are clear. Mildly enlarged cardio-mediastinal silhouette, which is likely accentuated by low lung volume. No acute osseous abnormalities. The soft tissues are within normal limits. IMPRESSION: Low lung volume. Mild diffuse pulmonary vascular congestion is most likely secondary to low lung volume. No frank pulmonary edema. Electronically Signed   By: Jules Schick M.D.   On: 04/14/2023 11:24    Procedures Procedures: not indicated.   Medications Ordered in ED Medications  sodium chloride 0.9 % bolus 1,000 mL (0 mLs Intravenous Stopped 04/14/23 1318)  ketorolac (TORADOL) 15 MG/ML injection 15 mg (15 mg Intravenous Given 04/14/23 1010)  metoCLOPramide (REGLAN) injection 10 mg (10 mg Intravenous Given 04/14/23 1010)  diphenhydrAMINE (BENADRYL) injection 12.5 mg (12.5 mg Intravenous Given 04/14/23 1010)     ED Course/ Medical Decision Making/ A&P                                 Medical Decision Making Amount and/or Complexity of Data Reviewed Labs: ordered. Radiology: ordered.  Risk Prescription drug management.   This patient presents to the ED for concern of palpitations, this involves an extensive number of treatment options, and is a complaint that carries with it a high risk of complications and morbidity.   Differential diagnosis includes: ACS, PE, arrhythmia, costochondritis, pleurisy, muscle strain, electrolyte abnormality, dehydration, etc.   Comorbidities  See HPI above   Additional History  Additional history obtained from prior records   Cardiac Monitoring / EKG  The patient was maintained on a cardiac monitor.  I personally viewed and interpreted the cardiac monitored which showed: sinus rhythm with a heart rate of 82 bpm.   Lab Tests  I ordered and personally interpreted labs.  The pertinent results include:   Negative troponin CMP and CBC are reassuring - no acute electrolyte derangement, infection, or anemia   Imaging Studies  I ordered imaging studies including CXR  I independently visualized and interpreted imaging which showed:  Mildly diffuse pulmonary vascular congestion, most likely secondary to low lung volume. No obvious pulmonary edema. I agree with the radiologist interpretation   Problem List / ED Course / Critical Interventions / Medication Management  Patient reports intermittent palpitations for the past several months.  States that he had less to some chest discomfort and shortness of breath last night that persisted to today which prompted him to come in for further evaluation.  He initially endorses several days of fatigue and headache. States that he sees a cardiologist in the past but has not followed up with them since 2020. Patient also reports some pain to the right shoulder.  He maintains full range of motion of the joint.   No osseous abnormalities present on x-ray. I ordered medications including: Toradol, Reglan, Benadryl and NS for headache and shoulder pain  Reevaluation of the patient after these medicines showed that the patient improved I have reviewed the patients home medicines and have made adjustments as needed   Social Determinants of Health  Access to healthcare   Test / Admission - Considered  Discussed findings with patient.  All questions were answered. He is stable and safe for discharge home. Return precautions given.       Final Clinical Impression(s) / ED Diagnoses Final diagnoses:  Palpitations    Rx / DC Orders ED Discharge Orders          Ordered    Ambulatory referral to Cardiology       Comments: If you have not heard from the Cardiology office within the next 72 hours please call [phone].   04/14/23 1240              Maxwell Marion, PA-C 04/14/23 1512    Rondel Baton, MD 04/18/23 1150

## 2023-05-10 DIAGNOSIS — Z419 Encounter for procedure for purposes other than remedying health state, unspecified: Secondary | ICD-10-CM | POA: Diagnosis not present

## 2023-06-09 DIAGNOSIS — Z419 Encounter for procedure for purposes other than remedying health state, unspecified: Secondary | ICD-10-CM | POA: Diagnosis not present

## 2023-07-10 DIAGNOSIS — Z419 Encounter for procedure for purposes other than remedying health state, unspecified: Secondary | ICD-10-CM | POA: Diagnosis not present

## 2023-08-09 DIAGNOSIS — Z419 Encounter for procedure for purposes other than remedying health state, unspecified: Secondary | ICD-10-CM | POA: Diagnosis not present

## 2023-09-09 DIAGNOSIS — Z419 Encounter for procedure for purposes other than remedying health state, unspecified: Secondary | ICD-10-CM | POA: Diagnosis not present

## 2023-10-10 DIAGNOSIS — Z419 Encounter for procedure for purposes other than remedying health state, unspecified: Secondary | ICD-10-CM | POA: Diagnosis not present

## 2023-10-22 ENCOUNTER — Telehealth: Payer: Self-pay

## 2023-10-22 NOTE — Telephone Encounter (Signed)
 Copied from CRM [id number]. Topic: Appointments - Appointment Scheduling >> Oct 22, 2023  9:19 AM Emylou G wrote: Please call patient.. wants to full phys w/labs but also says Leg keeps swelling up.. when he walks around.SABRA goes down when he lays down - unable to schedule with PCP.SABRA can he see someone else

## 2023-10-23 NOTE — Telephone Encounter (Signed)
Scheduled a CPE

## 2023-10-23 NOTE — Telephone Encounter (Signed)
 Patient advised UC, transferred to front to get him scheduled with anyone for CPE

## 2023-11-09 DIAGNOSIS — Z419 Encounter for procedure for purposes other than remedying health state, unspecified: Secondary | ICD-10-CM | POA: Diagnosis not present

## 2023-12-10 DIAGNOSIS — Z419 Encounter for procedure for purposes other than remedying health state, unspecified: Secondary | ICD-10-CM | POA: Diagnosis not present

## 2023-12-18 ENCOUNTER — Emergency Department (HOSPITAL_COMMUNITY)
Admission: EM | Admit: 2023-12-18 | Discharge: 2023-12-29 | Disposition: E | Attending: Emergency Medicine | Admitting: Emergency Medicine

## 2023-12-18 ENCOUNTER — Other Ambulatory Visit: Payer: Self-pay

## 2023-12-18 ENCOUNTER — Encounter (HOSPITAL_COMMUNITY): Payer: Self-pay

## 2023-12-18 DIAGNOSIS — I469 Cardiac arrest, cause unspecified: Secondary | ICD-10-CM | POA: Diagnosis not present

## 2023-12-18 LAB — I-STAT CHEM 8, ED
BUN: 23 mg/dL — ABNORMAL HIGH (ref 6–20)
Calcium, Ion: 1.09 mmol/L — ABNORMAL LOW (ref 1.15–1.40)
Chloride: 112 mmol/L — ABNORMAL HIGH (ref 98–111)
Creatinine, Ser: 0.7 mg/dL (ref 0.61–1.24)
Glucose, Bld: 130 mg/dL — ABNORMAL HIGH (ref 70–99)
HCT: 37 % — ABNORMAL LOW (ref 39.0–52.0)
Hemoglobin: 12.6 g/dL — ABNORMAL LOW (ref 13.0–17.0)
Potassium: 4.6 mmol/L (ref 3.5–5.1)
Sodium: 143 mmol/L (ref 135–145)
TCO2: 20 mmol/L — ABNORMAL LOW (ref 22–32)

## 2023-12-18 LAB — CBG MONITORING, ED: Glucose-Capillary: 143 mg/dL — ABNORMAL HIGH (ref 70–99)

## 2023-12-18 MED ORDER — NALOXONE HCL 4 MG/0.1ML NA LIQD
NASAL | Status: DC
Start: 2023-12-18 — End: 2023-12-18
  Filled 2023-12-18: qty 4

## 2023-12-18 MED ORDER — CALCIUM CHLORIDE 10 % IV SOLN
INTRAVENOUS | Status: AC | PRN
Start: 2023-12-18 — End: 2023-12-18
  Administered 2023-12-18: 1 g via INTRAVENOUS

## 2023-12-18 MED ORDER — NALOXONE HCL 0.4 MG/ML IJ SOLN
INTRAMUSCULAR | Status: AC
  Filled 2023-12-18: qty 1

## 2023-12-18 MED ORDER — SODIUM BICARBONATE 8.4 % IV SOLN
INTRAVENOUS | Status: AC | PRN
Start: 2023-12-18 — End: 2023-12-18
  Administered 2023-12-18: 50 meq via INTRAVENOUS

## 2023-12-18 MED ORDER — LACTATED RINGERS IV SOLN
INTRAVENOUS | Status: AC | PRN
  Administered 2023-12-18: 999 mL via INTRAVENOUS

## 2023-12-29 NOTE — Progress Notes (Signed)
 SPIRITUAL CARE AND COUNSELING CONSULT NOTE   VISIT SUMMARY: Called by ED Director to come to patient bedside. No family present and patient has passed. Chaplain provided support to staff as they called family and coordinated care in order to reach patient family. Provided support to family over the phone. No further needs at this time.   SPIRITUAL ENCOUNTER                                                                                                                                                                      Type of Visit: Initial Care provided to:: Patient Conversation partners present during encounter: Nurse Referral source: Code page Reason for visit: Code OnCall Visit: No   SPIRITUAL FRAMEWORK  Presenting Themes: Courage hope and growth, Community and relationships Community/Connection: Family Patient Stress Factors: None identified Family Stress Factors: Loss   GOALS       INTERVENTIONS   Spiritual Care Interventions Made: Established relationship of care and support, Reflective listening, Supported grief process    INTERVENTION OUTCOMES   Outcomes: Connection to spiritual care, Awareness of support  SPIRITUAL CARE PLAN   Spiritual Care Issues Still Outstanding: No further spiritual care needs at this time (see row info)    If immediate needs arise, please consult Zelda Salmon AMION schedule for chaplain coverage   Tanda PORTA [facility], Chaplain  06-Jan-2024 4:12 PM

## 2023-12-29 NOTE — Code Documentation (Signed)
Patient time of death occurred at 1505.

## 2023-12-29 NOTE — Code Documentation (Signed)
 Pt intubated by Dr. Towana at this time. RT assisting with Intubation at bedside.

## 2023-12-29 NOTE — ED Triage Notes (Signed)
 Pt arrived via POV c/o emesis since yesterday. Pt also endorses headache and chest discomfort.

## 2023-12-29 NOTE — Code Documentation (Signed)
 CPR restarted at this time. Pacing stopped.

## 2023-12-29 NOTE — Code Documentation (Signed)
 Pulse check performed, no palpable pulse, chest compressions continued, asystole on cardiac monitor.

## 2023-12-29 NOTE — Code Documentation (Signed)
 Pulse Check performed. No palpable pulse

## 2023-12-29 NOTE — Code Documentation (Signed)
 Pulse Check performed. Asystole on the monitor. No palpable pulse at this time.

## 2023-12-29 NOTE — ED Provider Notes (Addendum)
 [place] EMERGENCY DEPARTMENT AT [facility] Provider Note   CSN: [id number] Arrival date & time: 12/25/2023  1356     Patient presents with: Emesis   Jason Carpenter is a 48 y.o. male.  Patient presented to triage complaining of vomiting since yesterday with headache and chest discomfort.  He apparently ambulated back to the ed treatment room.  I went in to find patient unresponsive in the bed.  He had some agonal respirations.  He was not on the cardiac monitor at the time but was on a pulse ox but it was not reading.  I immediately called for help and we reposition the patient checked a fingerstick and started CPR.  Narcan  was given.  Patient was intubated without need for paralytics or sedation.  No family at bedside   HPI     Prior to Admission medications   Medication Sig Start Date End Date Taking? Authorizing Provider  albuterol  (VENTOLIN  HFA) 108 (90 Base) MCG/ACT inhaler Inhale 2 puffs into the lungs every 6 (six) hours as needed for wheezing or shortness of breath. 02/14/22   Del Orbe Polanco, Iliana, FNP  amLODipine  (NORVASC ) 10 MG tablet Take 1 tablet (10 mg total) by mouth daily. 06/27/22   Del Orbe Polanco, Iliana, FNP  cetirizine  (ZYRTEC  ALLERGY) 10 MG tablet Take 1 tablet (10 mg total) by mouth 2 (two) times daily. 03/17/22   Suellen Cantor A, PA-C  EPINEPHrine  0.3 mg/0.3 mL IJ SOAJ injection Inject 0.3 mg into the muscle as needed for anaphylaxis. 03/17/22   Suellen Cantor A, PA-C  gabapentin  (NEURONTIN ) 300 MG capsule Take 1 capsule (300 mg total) by mouth 3 (three) times daily as needed. 06/18/22   Del Orbe Polanco, Iliana, FNP  ibuprofen (ADVIL) 200 MG tablet Take 200 mg by mouth every 6 (six) hours as needed for mild pain or moderate pain.    [provider]  metoprolol  tartrate (LOPRESSOR ) 25 MG tablet Take 1 tablet (25 mg total) by mouth 2 (two) times daily. 06/27/22   Del Wilhelmena Lloyd Sola, FNP  olmesartan -hydrochlorothiazide  (BENICAR  HCT) 40-25  MG tablet Take 1 tablet by mouth daily. 06/27/22   Del Wilhelmena Lloyd Sola, FNP  rosuvastatin  (CRESTOR ) 10 MG tablet Take 1 tablet (10 mg total) by mouth daily. 06/28/22   Del Orbe Polanco, Iliana, FNP    Allergies: Penicillins    Review of Systems  Unable to perform ROS: Patient unresponsive    Updated Vital Signs BP (!) 55/38   Pulse 76   Temp 97.9 F (36.6 C) (Oral)   Resp 18   Ht 5' 3 (1.6 m)   Wt 113 kg   SpO2 97%   BMI 44.13 kg/m   Physical Exam Vitals and nursing note reviewed.  Constitutional:      Appearance: He is well-developed. He is obese. He is ill-appearing and diaphoretic.  HENT:     Head: Normocephalic and atraumatic.  Cardiovascular:     Comments: Patient had no pulses Pulmonary:     Comments: Minimal agonal respiration Musculoskeletal:        General: No deformity.     Cervical back: Neck supple.  Neurological:     Comments: Patient was not awake or alert.  He did not respond to voice or pain.     (all labs ordered are listed, but only abnormal results are displayed) Labs Reviewed  CBG MONITORING, ED - Abnormal; Notable for the following components:      Result Value   Glucose-Capillary 143 (*)  All other components within normal limits  I-STAT CHEM 8, ED - Abnormal; Notable for the following components:   Chloride 112 (*)    BUN 23 (*)    Glucose, Bld 130 (*)    Calcium , Ion 1.09 (*)    TCO2 20 (*)    Hemoglobin 12.6 (*)    HCT 37.0 (*)    All other components within normal limits    EKG: EKG Interpretation Date/Time:  Thursday 11-Jan-2024 14:35:26 EST Ventricular Rate:  31 PR Interval:    QRS Duration:  99 QT Interval:  394 QTC Calculation: 283 R Axis:   22  Text Interpretation: AV block, complete (third degree) Low voltage, extremity and precordial leads Abnormal R-wave progression, early transition Nonspecific T abnormalities, lateral leads ST elevation, consider lateral injury new from prior 3/25 Confirmed by Towana Sharper [phone]) on 01/11/2024 3:08:10 PM  Radiology: No results found.   .Critical Care  Performed by: Towana Sharper BROCKS, MD Authorized by: Towana Sharper BROCKS, MD   Critical care provider statement:    Critical care time (minutes):  45   Critical care time was exclusive of:  Separately billable procedures and treating other patients   Critical care was necessary to treat or prevent imminent or life-threatening deterioration of the following conditions:  Cardiac failure   Critical care was time spent personally by me on the following activities:  Development of treatment plan with patient or surrogate, discussions with consultants, evaluation of patient's response to treatment, examination of patient, obtaining history from patient or surrogate, ordering and performing treatments and interventions, ordering and review of laboratory studies, ordering and review of radiographic studies, pulse oximetry, re-evaluation of patient's condition and review of old charts   I assumed direction of critical care for this patient from another provider in my specialty: no   Procedure Name: Intubation Date/Time: 01/11/24 3:15 PM  Performed by: Towana Sharper BROCKS, MDPre-anesthesia Checklist: Patient identified, Patient being monitored, Emergency Drugs available, Timeout performed and Suction available Oxygen Delivery Method: Non-rebreather mask Preoxygenation: Pre-oxygenation with 100% oxygen Ventilation: Mask ventilation with difficulty Laryngoscope Size: 3 and Glidescope Grade View: Grade II Number of attempts: 1 Placement Confirmation: ETT inserted through vocal cords under direct vision, CO2 detector and Breath sounds checked- equal and bilateral Secured at: 24 cm Tube secured with: Tape Dental Injury: Teeth and Oropharynx as per pre-operative assessment     Ultrasound ED Echo  Date/Time: 12/29/2023 9:49 AM  Performed by: Towana Sharper BROCKS, MD Authorized by: Towana Sharper BROCKS, MD   Procedure  details:    Indications: cardiac arrest     Views: parasternal long axis view     Images: not archived     Limitations:  Body habitus and positioning Findings:    Pericardium: no pericardial effusion     Cardiac Activity: agonal      Medications Ordered in the ED  naloxone  (NARCAN ) 4 MG/0.1ML nasal spray kit (0 sprays  Hold 01/11/2024 1445)  naloxone  (NARCAN ) 0.4 MG/ML injection (  Given 01-11-24 1436)  lactated ringers  infusion (0 mLs Intravenous Stopped 01/11/24 1508)  calcium  chloride injection (1 g Intravenous Given 2024-01-11 1445)  sodium bicarbonate  injection (50 mEq Intravenous Given 2024/01/11 1450)    Clinical Course as of 01/11/24 1630  Thu 01-11-24  1516 Patient's blood sugar was 143.  No improvement with Narcan .  I-STAT does not show any significant derangement that would account for patient's acute condition.  EKG shows probable complete heart block with ST  elevation.  Bedside ultrasound showed minimal cardiac activity.  Multiple rounds of epi calcium  and bicarb without improvement in condition.  Due to EKG showing heart block attempted to pace patient without capture.  Ultimately code called at 3:05 PM [MB]  1518 Discussed with ME Elspeth Loving who is accepting the case for ME.  He will be by later.  Patient can go down to morgue.  We are trying to reach family [MB]    Clinical Course User Index [MB] Towana Ozell BROCKS, MD                                 Medical Decision Making Risk Prescription drug management.   This patient complains of vomiting; this involves an extensive number of treatment Options and is a complaint that carries with it a high risk of complications and morbidity. The differential includes ACS, vascular, PE, dissection, pneumothorax  I ordered, reviewed and interpreted labs, which included fingerstick within normal limits, i-STAT chemistry without significant abnormalities I ordered medication medications for code, Narcan  and reviewed PMP when  indicated. Previous records obtained and reviewed in epic no recent ED visits I consulted medical examiner and discussed lab and imaging findings and discussed disposition.  Cardiac monitoring reviewed, possible heart block Social determinants considered, unknown Critical Interventions: Intubation and CPR  After the interventions stated above, I reevaluated the patient and found without any signs of life Admission and further testing considered, code called at 3:05 PM.  Nurse and I discussed with patient's cousin.  He will attempt to reach patient's mother.  ME is accepting case      Final diagnoses:  Cardiac arrest [facility])    ED Discharge Orders     None          Towana Ozell BROCKS, MD 2024/01/15 1634    Towana Ozell BROCKS, MD 12/29/23 [phone]

## 2023-12-29 NOTE — ED Notes (Signed)
 EDP at bedside and reports Pt to be found unresponsive. ED Staff responded and began ACLS/CPR.

## 2023-12-29 DEATH — deceased

## 2024-02-09 ENCOUNTER — Encounter
